# Patient Record
Sex: Male | Born: 1955 | Race: White | Hispanic: No | Marital: Single | Smoking: Current every day smoker
Health system: Southern US, Community
[De-identification: ages and names within clinical notes are randomized; demographics above are authoritative.]

## PROBLEM LIST (undated history)

## (undated) DIAGNOSIS — S42309A Unspecified fracture of shaft of humerus, unspecified arm, initial encounter for closed fracture: Secondary | ICD-10-CM

## (undated) DIAGNOSIS — T7840XA Allergy, unspecified, initial encounter: Secondary | ICD-10-CM

## (undated) DIAGNOSIS — I10 Essential (primary) hypertension: Secondary | ICD-10-CM

## (undated) DIAGNOSIS — E119 Type 2 diabetes mellitus without complications: Secondary | ICD-10-CM

## (undated) DIAGNOSIS — F329 Major depressive disorder, single episode, unspecified: Secondary | ICD-10-CM

## (undated) HISTORY — DX: Major depressive disorder, single episode, unspecified: F32.9

## (undated) HISTORY — DX: Unspecified fracture of shaft of humerus, unspecified arm, initial encounter for closed fracture: S42.309A

## (undated) HISTORY — PX: APPENDECTOMY: SHX54

## (undated) HISTORY — DX: Allergy, unspecified, initial encounter: T78.40XA

## (undated) HISTORY — PX: KNEE ARTHROPLASTY: SHX992

## (undated) HISTORY — DX: Essential (primary) hypertension: I10

## (undated) HISTORY — DX: Type 2 diabetes mellitus without complications: E11.9

---

## 1998-06-26 ENCOUNTER — Emergency Department (HOSPITAL_COMMUNITY): Admission: EM | Admit: 1998-06-26 | Discharge: 1998-06-26 | Payer: Self-pay | Admitting: Internal Medicine

## 2014-01-16 LAB — COMPREHENSIVE METABOLIC PANEL
Albumin: 4 g/dL (ref 3.4–5.0)
Alkaline Phosphatase: 108 U/L
Anion Gap: 13 (ref 7–16)
BUN: 6 mg/dL — ABNORMAL LOW (ref 7–18)
Bilirubin,Total: 0.5 mg/dL (ref 0.2–1.0)
Calcium, Total: 9.5 mg/dL (ref 8.5–10.1)
Chloride: 97 mmol/L — ABNORMAL LOW (ref 98–107)
Co2: 24 mmol/L (ref 21–32)
Creatinine: 0.59 mg/dL — ABNORMAL LOW (ref 0.60–1.30)
EGFR (African American): >60
EGFR (Non-African Amer.): >60
Glucose: 222 mg/dL — ABNORMAL HIGH (ref 65–99)
Osmolality: 273 (ref 275–301)
Potassium: 3.3 mmol/L — ABNORMAL LOW (ref 3.5–5.1)
SGOT(AST): 50 U/L — ABNORMAL HIGH (ref 15–37)
SGPT (ALT): 44 U/L
Sodium: 134 mmol/L — ABNORMAL LOW (ref 136–145)
Total Protein: 8.2 g/dL (ref 6.4–8.2)

## 2014-01-16 LAB — URINALYSIS, COMPLETE
Bacteria: NONE SEEN
Bilirubin,UR: NEGATIVE
Blood: NEGATIVE
Glucose,UR: >=500 mg/dL (ref 0–75)
Leukocyte Esterase: NEGATIVE
Nitrite: NEGATIVE
Ph: 5 (ref 4.5–8.0)
Protein: 30
RBC,UR: 1 /HPF (ref 0–5)
Specific Gravity: 1.023 (ref 1.003–1.030)
Squamous Epithelial: 1
WBC UR: 2 /HPF (ref 0–5)

## 2014-01-16 LAB — DRUG SCREEN, URINE

## 2014-01-16 LAB — CBC
HCT: 48 % (ref 40.0–52.0)
HGB: 16.9 g/dL (ref 13.0–18.0)
MCH: 34.6 pg — ABNORMAL HIGH (ref 26.0–34.0)
MCHC: 35.3 g/dL (ref 32.0–36.0)
MCV: 98 fL (ref 80–100)
Platelet: 232 10*3/uL (ref 150–440)
RBC: 4.89 10*6/uL (ref 4.40–5.90)
RDW: 13.9 % (ref 11.5–14.5)
WBC: 10 10*3/uL (ref 3.8–10.6)

## 2014-01-16 LAB — ACETAMINOPHEN LEVEL: Acetaminophen: <2 ug/mL

## 2014-01-16 LAB — ETHANOL
Ethanol %: 0.003 % (ref 0.000–0.080)
Ethanol: 3 mg/dL

## 2014-01-16 LAB — SALICYLATE LEVEL: Salicylates, Serum: 2.4 mg/dL

## 2014-01-17 ENCOUNTER — Inpatient Hospital Stay: Payer: Self-pay | Admitting: Psychiatry

## 2014-01-20 LAB — LIPID PANEL
Cholesterol: 255 mg/dL — ABNORMAL HIGH (ref 0–200)
HDL Cholesterol: 38 mg/dL — ABNORMAL LOW (ref 40–60)
Ldl Cholesterol, Calc: 184 mg/dL — ABNORMAL HIGH (ref 0–100)
Triglycerides: 165 mg/dL (ref 0–200)
VLDL Cholesterol, Calc: 33 mg/dL (ref 5–40)

## 2014-01-20 LAB — HEMOGLOBIN A1C: Hemoglobin A1C: 11.1 % — ABNORMAL HIGH (ref 4.2–6.3)

## 2014-03-04 DIAGNOSIS — I10 Essential (primary) hypertension: Secondary | ICD-10-CM | POA: Insufficient documentation

## 2014-03-04 DIAGNOSIS — E785 Hyperlipidemia, unspecified: Secondary | ICD-10-CM | POA: Insufficient documentation

## 2014-03-04 DIAGNOSIS — F32A Depression, unspecified: Secondary | ICD-10-CM | POA: Insufficient documentation

## 2014-03-04 DIAGNOSIS — F329 Major depressive disorder, single episode, unspecified: Secondary | ICD-10-CM | POA: Insufficient documentation

## 2014-03-04 DIAGNOSIS — E119 Type 2 diabetes mellitus without complications: Secondary | ICD-10-CM | POA: Insufficient documentation

## 2014-03-26 DIAGNOSIS — G629 Polyneuropathy, unspecified: Secondary | ICD-10-CM | POA: Insufficient documentation

## 2014-03-29 ENCOUNTER — Emergency Department: Payer: Self-pay | Admitting: Emergency Medicine

## 2014-04-09 ENCOUNTER — Ambulatory Visit: Payer: Self-pay | Admitting: General Practice

## 2014-04-15 ENCOUNTER — Observation Stay: Payer: Self-pay | Admitting: General Practice

## 2014-04-16 LAB — BASIC METABOLIC PANEL
Anion Gap: 8 (ref 7–16)
BUN: 9 mg/dL (ref 7–18)
CHLORIDE: 103 mmol/L (ref 98–107)
Calcium, Total: 8.3 mg/dL — ABNORMAL LOW (ref 8.5–10.1)
Co2: 26 mmol/L (ref 21–32)
Creatinine: 0.68 mg/dL (ref 0.60–1.30)
EGFR (Non-African Amer.): >60
Glucose: 141 mg/dL — ABNORMAL HIGH (ref 65–99)
OSMOLALITY: 275 (ref 275–301)
POTASSIUM: 4.4 mmol/L (ref 3.5–5.1)
SODIUM: 137 mmol/L (ref 136–145)

## 2014-04-16 LAB — HEMOGLOBIN: HGB: 12.3 g/dL — ABNORMAL LOW (ref 13.0–18.0)

## 2014-04-16 LAB — PLATELET COUNT: Platelet: 302 10*3/uL (ref 150–440)

## 2014-04-30 LAB — HM DIABETES EYE EXAM

## 2014-06-19 DIAGNOSIS — S42309A Unspecified fracture of shaft of humerus, unspecified arm, initial encounter for closed fracture: Secondary | ICD-10-CM

## 2014-06-19 HISTORY — PX: HUMERUS FRACTURE SURGERY: SHX670

## 2014-06-19 HISTORY — DX: Unspecified fracture of shaft of humerus, unspecified arm, initial encounter for closed fracture: S42.309A

## 2014-09-29 LAB — LIPID PANEL
CHOLESTEROL: 232 mg/dL — AB (ref 0–200)
HDL: 90 mg/dL — AB (ref 35–70)
LDL Cholesterol: 121 mg/dL
TRIGLYCERIDES: 104 mg/dL (ref 40–160)

## 2014-10-10 NOTE — Consult Note (Signed)
PATIENT NAME:  Matthew Washington, Massimiliano H MR#:  [id number] DATE OF BIRTH:  Mar 25, 1956  DATE OF CONSULTATION:  01/20/2014  CONSULTING PHYSICIAN:  Vivek J. Cherlynn KaiserSainani, MD  REFERRING PHYSICIAN:  Jolanta B. Pucilowska, MD.  PRIMARY CARE PHYSICIAN:  None.  REASON FOR CONSULTATION: New-onset diabetes.   HISTORY OF PRESENT ILLNESS: This is a 59 year old male who was admitted to behavioral medicine due to depression and suicidal ideations. The patient was recently diagnosed with diabetes about 4-5 days ago. He was started on metformin but has not been taking it since he was admitted to behavioral medicine. The patient although has been on the metformin while he has been here in the hospital. His blood sugars are still somewhat uncontrolled. Hospitalist services was contacted for further treatment and evaluation.   The patient denies any chest pain, shortness of breath, nausea, vomiting, abdominal pain, fevers, chills, cough or any other associated symptoms presently.   REVIEW OF SYSTEMS: CONSTITUTIONAL: No documented fever. No weight gain, no weight loss.  EYES: No blurry or double vision.  EARS, NOSE, THROAT: No tinnitus. No postnasal drip. No redness of the oropharynx.  RESPIRATORY: No cough, no wheeze, no hemoptysis, no dyspnea.  CARDIOVASCULAR: No chest pain, no orthopnea, no palpitations, or syncope.  GASTROINTESTINAL: No nausea, no vomiting, no diarrhea, no abdominal pain. No melena or hematochezia.  GENITOURINARY: No dysuria or hematuria.  ENDOCRINE: No polyuria or nocturia. No heat or cold intolerance.  HEMATOLOGIC: No anemia, no bruising, no bleeding.  INTEGUMENT: No rashes or lesions.  MUSCULOSKELETAL: No arthritis, no swelling, no gout.  NEUROLOGIC: No numbness or tingling. No ataxia. No seizure-type activity,  PSYCHIATRIC: Positive depression, no anxiety. No ADD.   PAST MEDICAL HISTORY: Consistent with depression.   ALLERGIES: PENICILLIN, WHICH CAUSE HIVES.   SOCIAL HISTORY: Does smoke about  2 packs per day, has been smoking for the last 30-40 years, also used to drink heavily but quit a few years back. No illicit drug abuse. Lives by himself.   FAMILY HISTORY: Mother and father are both deceased, neither of them had diabetes. Nobody in his family has diabetes presently.   PHYSICAL EXAMINATION:  VITAL SIGNS: Temperature 97.4, pulse 114, respirations 20, blood pressure 115/76.  GENERAL: He is a pleasant-appearing male in no apparent distress.  HEAD, EYES, EARS, NOSE AND THROAT: Atraumatic, normocephalic. Extraocular muscles are intact. Pupils are reactive to light. Sclerae are anicteric. No conjunctival injection. No pharyngeal erythema.  NECK: Supple. No jugular venous distention. No bruits. No lymphadenopathy or thyromegaly.  HEART: Regular rate and rhythm. No murmurs, no rubs, no clicks.  LUNGS: Clear to auscultation bilaterally. No rales, no rhonchi, no wheezes.  ABDOMEN: Soft, flat, nontender, nondistended. Has good bowel sounds. No hepatosplenomegaly appreciated.  EXTREMITIES: No evidence of any cyanosis, clubbing, or peripheral edema. Has +2 pedal and radial pulses bilaterally.  NEUROLOGICAL: The patient is alert, awake, and oriented x 3 with no focal, motor or sensory deficits.  SKIN: Moist and warm with no rashes appreciated.  LYMPHATIC: There is no cervical or axillary lymphadenopathy.   LABORATORY DATA: This is lab work done on July 31: Urine toxicology is negative. Serum glucose of 222, BUN 6, creatinine 0.5, sodium 134, potassium 3.3, chloride 94, bicarbonate 24. LFTs within normal limits. White cell count is 10, hemoglobin 16.9, hematocrit 48, platelet count 232,000.    The patient's lipid profile checked on August 4 showed an LDL of 184, total cholesterol 55, HDL 38, hemoglobin A1c of 11.   ASSESSMENT AND PLAN: The patient is  a 59 year old male with long-standing and depression who was admitted to behavioral medicine for suicidal ideations, noted to have elevated blood  sugars with hyperlipidemia.  1.  New-onset diabetes. The patient's blood sugars are somewhat labile right now, although I do not think he has given the metformin a chance to work yet, as he has not been taking it. I would continue metformin for now. The patient likely will need his oral meds adjusted as an outpatient. He needs to get himself a primary care physician so this can be accomplished as an outpatient. The patient likely also needs a diabetic lifestyle assessment to be taught about the disease process. He also needs a glucometer, test strips and lancets, which he said he can purchase on the outside.  2.  Hyperlipidemia. The patient's total cholesterol is over 200, LDL is greater than 130. The patient likely needs to be started on therapy. I will start him on some low-dose simvastatin for now. He likely needs to have a lipid profile checked in the next 3 months or so.  3.  Proteinuria. The patient's urinalysis did show some mild proteinuria. I will start him on a low-dose ACE inhibitor for now.  4.  Depression with suicidal ideations. Continue care as per psychiatry.   Thank you so much for the consultation.  The patient is okay to be discharged from a medical standpoint on the medications as mentioned above.   Time spent in this consult: 50 minutes.   ____________________________ Rolly Pancake. Cherlynn Kaiser, MD vjs:lt D: 01/20/2014 10:56:27 ET T: 01/20/2014 11:59:07 ET JOB#: [id number]  cc: Rolly Pancake. Cherlynn Kaiser, MD, <Dictator> Houston Siren MD ELECTRONICALLY SIGNED 01/30/2014 14:26

## 2014-10-10 NOTE — H&P (Signed)
PATIENT NAME:  Matthew Washington, Dirk H MR#:  [id number] DATE OF BIRTH:  1955-10-08  DATE OF ADMISSION:  01/17/2014  RACE:  White.  AGE:  59.  EXAMINING SERVICE:  Psychiatry.  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  Patient lives with a roommate in an apartment.  Patient comes for  inpatient on psychiatry at [facility] with a chief complaint of being depressed and having suicidal ideas for the past 2 days and thoughts about taking an overdose of pills.  HISTORY OF PRESENT ILLNESS:  The patient reports that recently he was diagnosed with [facility] and they just diagnosed him and gave him metformin medication and let him go without explaining all the details about the same.  He started getting depressed to the extent that he started having suicidal ideas and so he told his doctor at [facility] Dr. Marguerite OleaMoffett who recommended inpatient hospitalization.    PAST PSYCHIATRIC HISTORY:  No previous history of inpatient on psychiatry.  Being followed by Dr[facility] at [facility] for several years for depression.  No history of suicides.  According to the information obtained the dad, patient has been stable on Prozac 40 mg and Ambien 10 mg for his depression.  FAMILY MENTAL HISTORY:  No known H/O mental health problems in the family. no suicides within the family.    FAMILY HISTORY:  Raise by parents.  Father is retired from Public relations account executive[employer].  Father is living.  Mother died after status post motor vehicle accident and complications for the same.  Has 4 brothers and 4 sisters.  He close to the family.  PERSONAL HISTORY:  Born in [place].  Moved to [place] in [place].  Graduated from high school, no college.  Work:  His long job was in Therapist, occupationalcatering and worked for 15 years.  Last worked 4-1/2 years ago and quit because he could not keep up with the job.    MILITARY HISTORY:  None.  MARRIAGES:  Never married.  No children.    ALCOHOL AND DRUGS:  Used to drink alcohol occasionally.  Had 1 DWI and lost his driver's license.  Apparently, he got back his driver's license.  Denies street or prescription drug abuse.  Does smoke nicotine cigarettes at a rate of a pack a day for many years.  MEDICAL HISTORY:  Normalized blood pressure.  Was diagnosed with diabetes mellitus only a few days ago.  Status post appendectomy.  Status post calcium deposit removed from the neck.  No history of MVA and.  never been unconscious.   ALLERGIES:  PENICILLIN.   Not being followed by physician .  Last appointment with physician was quite some time ago and he cannot remember the date.    PHYSICAL EXAMINATION: VITAL SIGNS:  Temperature is 98.4, pulse is 90 per minute and regular, respirations 20 per minute and regular, blood pressure is 130/84 mmHg.  HEENT:  Normocephalic, atraumatic.  Eyes:  PERLA.  Fundi benign.   NECK: Supple without any organomegaly or thyromegaly. CHEST:  Normal expansion.  Normal breath sounds.   HEART:  Normal S1 without any murmurs or rubs.  ABDOMEN:  Soft.  No organomegaly.  Bowel sounds heard. RECTAL:  Deferred. NEUROLOGIC:  Gait is 2+ normal.  Romberg is negative.  Cranial nerves II-XII grossly intact.   MENTAL STATUS EXAMINATION:  Patient dressed  in street clothes.  Alert and oriented to place, person, and time.  Fully aware of the situation.  Has been feeling hopeless and helpless since his recent diagnosis of diabetes, but however, he started feeling better since he came here.   Had suicidal wishes, but currently contracts for safety.  No psychosis.  Denies auditory or visual delusions and paranoid thinking.  Denies any thought using thought control.  He could spell the word "world" forward, but could not spell it backwards.  Memory is intact.  Cognition is intact.  General knowledge and permission state regarding judgment for fire.  He  said he would leave or put it out.  Insight and judgment guarded.  Impulse control poor.  IMPRESSION:   AXIS I:  Major depressive, single episode, with suicidal ideas, and contract for safety.  Rule out adjustment disorder with depressed mood since he was diagnosed with diabetes a week ago and got depressed about the same.  Nicotine dependence.  Alcohol abuse in remission. AXIS II:  Deferred. AXIS III:  Status post appendectomy and status post calcium deposit removed.  Diagnosed with diabetes mellitus a few days ago.  AXIS IV:  Long history of depression and being followed by Dr. Marguerite Olea at [facility] and recent diagnosis of diabetes mellitus and not having enough coping skills in dealing with the new diagnosis of diabetes mellitus. AXIS V:  Global Assessment of Functioning 25.  PLAN:  Patient admitted to [facility] for close observation while here.  He will be started back on his Prozac, on which he has been stabilized at [facility] for his depression.  He will be started back on his metformin.  During this stay in the hospital, he will be given milieu therapy and supportive counseling. Better coping skills in dealing with the recent diagnosis of diabetes mellitus and dealing with the same.  At that time, we will discharge the patient when stabilized and follow up appointment will be made in that community for medical help and mental health.   ____________________________ Jannet Mantis. Guss Bunde, MD skc:ts D: 01/17/2014 19:17:06 ET T: 01/17/2014 19:26:35 ET JOB#: [id number]  cc: Monika Salk K. Guss Bunde, MD, <Dictator> Beau Fanny MD ELECTRONICALLY SIGNED 01/18/2014 9:50

## 2014-10-10 NOTE — Consult Note (Signed)
PATIENT NAME:  Matthew Washington,Matthew Washington.E MR#:  [id number] DATE OF BIRTH:  07/20/1953  DATE OF H/P:  01/17/2014  CONSULTING PHYSICIAN:  Jaydeen Darley K. Guss Bundehalla, MD  SEX: Male.  RACE: White.  AGE: 59 years.  INITIAL PSYCHIATRIC EVALUATION:  IDENTIFYING INFORMATION: The patient is a 59 year old white male retired from working on [employer] and is divorced 3 times and admits that he lives by himself. The patient comes to inpatient admission on psychiatry at [facility] with a chief complaint "I'm tired of drinking, I want to quit drinking."   HISTORY OF PRESENT ILLNESS: The patient reports that he has been drinking all of his life and currently drinking at the rate of 6-pack of beer or even more and decided that he should get help for drinking.  PAST PSYCHIATRIC HISTORY: History of inpatient holds on psychiatry on 2 occasions for alcohol detox. He reports that he started drinking under stress and this time because of breakup with a relationship.   HISTORY OF SUICIDE ATTEMPT: Ten years ago and he drank rat poison and was admitted to the hospital and does not remember the details of the same.   FAMILY HISTORY OF MENTAL ILLNESS: No known H/O mental illness in family., no known history of suicide in the family. He was raised by parents who are both dead. Has siblings and he is in touch with them sometimes only.  SOCIAL HISTORY: Patient was born in [place] and raised in [place] by both of his biological parents until they got divorced when he was 59 years old. He moved from [place] to [place] at age 89 years. They lived with the biological father and stepmother after that. Has 1 sister, 1 half brother, and 1 half sister. He dropped out at a tenth grade education. Denies any physical or sexual abuse while growing up. He never got his GED, started working on [employer] for the past 30 years and unable to find steady employment for the past several years and so retired.  MARRIAGE HISTORY: Married 3 times  and is divorced from his third wife of too many years. Has 2 children, 1 son and 1 daughter who are grown. He gets to see them sometimes.  LEGAL HISTORY: Denies any arrests or incarcerations. When he was asked about DWI he said "plenty I guess". He walks most of the time. Does admit smoking nicotine cigarettes, a pack a day for many years.  MEDICAL HISTORY: No know history of high blood pressure, no diabetes mellitus, no major surgery, no major injuries. No history of MVA  never been unconscious. H/O Pulmonary Embolism and was treated for the same and had a GI Bleeed in April of 2015 when he was admitted at [facility] and was evaluated and Gastric work up was done which was negative and was stopped Coumadine and no further information is obted if this was started back on not and staff will check with [facility] on Monday 01/19/2014 for the same.  ALLERGIES: No known drug allergies.  Being [facility], last appointment and month ago. Next appointment needs to be made.   According to information obtained from the chart, he was inpatient at [facility] in April 2015 for a gastrointestinal bleed and discharge diagnosis of melena with history of esophagogastroduodenoscopy which was negative and colonoscopy which was negative for acute blood. The patient was stopped off Coumadin which was not restarted according to the charts and this should be checked out by the staff on Monday morning.  PHYSICAL  EXAMINATION: VITALS SIGNS: Temperature is 98.4, respirations 18 per minute regular, heart rate is 78 per minute regular, blood pressure is 118/80 mmHg.  HEENT: Head is normocephalic, atraumatic. Eyes: PERRLA, fundi are benign. Dentition was extremely poor. Extraocular movements are intact.  NECK: Supple without any organomegaly, thyromegaly. LUNGS: Clear to auscultation.  HEART: S1, S2 heard with a regular rate and rhythm. No murmurs, rubs, or gallops.  ABDOMEN: Soft, no organomegaly. Bowel sounds  heard. EXTREMITIES: No cyanosis, clubbing, or edema, 2+ pedal pulses. RECTAL: Deferred. PELVIC: Deferred. NEUROLOGIC: Gait was slightly unsteady due to recent alcohol intoxication. Cranial nerves II through XII are grossly intact.  MENTAL STATUS EXAMINATION: The patient is dressed in hospital scrubs, disheveled in appearance. Very poor grooming. He knows that he was at [facility] and knew the date. He knew the capital of [address], capital of [place], name of the current president. Affect is flat, mood restricted. Appears depressed. He denies any suicidal thoughts or ideas and wants to get help. Contracts for safety. Admits that he hears voices and these voices are voices of conscious telling him to do this and do that and but ignores the same. He had difficulty spelling the word world forward because of his education level. Recalls for recent and remote events was intact after a few minutes and several minutes. He could count money. Insight and judgment guarded. Impulse control poor.  IMPRESSION:  AXIS I: Major depressive disorder, recurrent. Alcohol dependence, chronic continuous with intoxication, nicotine dependence.   AXIS II: Deferred.   AXIS III: Chronic right hip pain. A history of being on Coumadin which was stopped at [facility].    AXIS IV: Severe. Occupational, financial, and loneliness and not able to keep up a healthy relationship.   AXIS V: GAF 25 at the time of admission.  PLAN: Patient admitted to [facility] for close observation and help. He will be started on detox protocol. During the stay in the hospital, he will be given milieu therapy and supportive counseling. He will take part in and individual group therapy when he is ready to do so with special attention to substance abuse and consequences of the same. At the time of discharge, patient is stabilized, appropriate followup appointments made in the community.     ____________________________ Jannet Mantis.  Guss Bunde, MD skc:lt D: 01/17/2014 18:36:32 ET T: 01/17/2014 21:21:19 ET JOB#: [id number]  cc: Monika Salk K. Guss Bunde, MD, <Dictator> Beau Fanny MD ELECTRONICALLY SIGNED 01/18/2014 9:58

## 2014-10-10 NOTE — Op Note (Signed)
PATIENT NAME:  Matthew Washington, Matthew Washington MR#:  [id number] DATE OF BIRTH:  [birth year]  DATE OF PROCEDURE:  04/15/2014  PREOPERATIVE DIAGNOSIS: Right proximal humerus fracture (surgical neck).   POSTOPERATIVE DIAGNOSIS: Right proximal humerus fracture (surgical neck).    PROCEDURE PERFORMED: Open reduction and internal fixation of a right proximal humerus fracture.   SURGEON: Illene Labrador. Angie Fava., M.D.    ANESTHESIA: Interscalene block and general endotracheal.   ESTIMATED BLOOD LOSS: 250 mL.   FLUIDS REPLACED: 2700 mL of crystalloid.   DRAINS: One small Hemovac.   IMPLANTS UTILIZED: A Biomet 4-hole S3 proximal humerus plate, four 3.8 mm screws, six 4.0 mm standard pegs, and 2 set screws.   INDICATIONS FOR SURGERY: The patient is a 59 year old male, who fell approximately 10 days ago and landed on his right shoulder and upper arm. He had been evaluated in the Emergency Room, and placed in a sling. Upon presentation to the office, repeat radiographs demonstrated gross displacement of the right proximal humerus fracture. After discussion of the risks and benefits of surgical intervention, the patient expressed understanding of the risks and benefits, and agreed with plans for surgical intervention.   PROCEDURE IN DETAIL: The patient was brought to the operating room, and after adequate interscalene block and general endotracheal anesthesia was achieved, the patient was positioned in a beach chair position. The head was secured in a headrest and all bony prominences were well padded. Visualization of the proximal humerus was performed in multiple planes using the C-arm. The patient's right shoulder and arm were cleaned and prepped with alcohol and DuraPrep, draped in the usual sterile fashion. A "timeout" was performed as per usual protocol.   An anterior oblique incision was made extending from the mid clavicle distally towards the insertion of the deltoid. The subcutaneous tissue was dissected and the  deltopectoral interval was identified. The cephalic vein was mobilized and reflected medially. Dissection was continued laterally with evacuation of a large hematoma. Fracture site was identified and reduction maneuvers were performed. A 4-hole S3 proximal humerus plate was positioned and an initial guidewire was inserted. Good position was noted in multiple planes using the C-arm. The distal portion of the plate was then initially stabilized with a 3.8 mm screw in the slotted position. Again, good position was noted. Proximal fixation was then undertaken with placement of six 4.0 mm standard pegs. Good position was noted in multiple positions using the C-arm. Finally, the distal portion of the plate was further stabilized with an additional 3.8 mm cortical screw and two 3.8 mm screws utilized as locking screws with the associated small set screws. The shoulder was placed through range of motion with good maintenance of the reduction. Excellent position of the hardware and reduction was noted using the C-arm.   The wound was irrigated with copious amounts of normal saline with antibiotic solution and suctioned dry. Good hemostasis was appreciated. A Hemovac drain was placed in the wound bed and brought out through a separate stab incision to be attached to a Hemovac reservoir. The deltoid interval was repaired using interrupted sutures of #1 Vicryl. The subcutaneous tissue was approximated in layers using first #0 Vicryl. The subcutaneous tissue was approximated in layers using #2-0 Vicryl. The skin was closed with skin staples. A sterile dressing was applied followed by application of a sling.   The patient tolerated the procedure well. He was transported to the recovery room in stable condition.    ____________________________ Illene Labrador. Angie Fava., MD jph:JT D: 04/16/2014  00:24:37 ET T: 04/16/2014 11:14:45 ET JOB#: [id number]  cc: Fayrene FearingJames P. Angie FavaHooten Jr., MD, <Dictator> JAMES P Angie FavaHOOTEN JR MD ELECTRONICALLY  SIGNED 04/17/2014 9:55

## 2014-10-10 NOTE — Consult Note (Signed)
PATIENT NAME:  Matthew Washington, Merrit H MR#:  [id number] DATE OF BIRTH:  [birth year]  DATE OF CONSULTATION:  01/20/2014  CONSULTING PHYSICIAN:  Issa Kosmicki J. Cherlynn KaiserSainani, MD REFERRING PHYSICIAN: Jolanta B. Pucilowska, MD  PRIMARY CARE PHYSICIAN: Does not have one.   REASON FOR CONSULTATION: New-onset diabetes.   HISTORY OF PRESENT ILLNESS: This is a 59 year old male who was admitted to behavioral medicine due to depression and suicidal ideations. The patient was recently diagnosed with diabetes about 4-5 days ago. He was started on metformin but has not been taking it since he was admitted to behavioral medicine. The patient also has been on the metformin while he has been here in the hospital. His blood sugars are still somewhat uncontrolled. Hospice services were contacted for further treatment and evaluation. The patient denies any chest pain, shortness of breath, nausea, vomiting, abdominal pain, fevers, chills, cough, or any other associated symptoms presently.   REVIEW OF SYSTEMS: CONSTITUTIONAL: No documented fever. No weight gain or weight loss.  EYES: No blurry or double vision.  ENT: No tinnitus. No postnasal drip. No redness of the oropharynx.  RESPIRATORY: No cough, no wheeze, no hemoptysis, no dyspnea.  CARDIOVASCULAR: No chest pain, no orthopnea, no palpitations, no syncope.  GASTROINTESTINAL: No nausea or vomiting. No diarrhea. No abdominal pain. No melena or hematochezia.  GENITOURINARY: No dysuria or hematuria.  ENDOCRINE: No polyuria or nocturia. No cold intolerance. HEMATOLOGIC:  No anemia, no bruising, no bleeding.  INTEGUMENTARY: No rashes or lesions.  MUSCULOSKELETAL: No arthritis, no swellimg, no gout.  NEUROLOGIC: No numbness or tingling. No ataxia. No seizure activity. PSYCHIATRIC:  Positive depression.  No anxiety. No ADD.   PAST MEDICAL HISTORY: Consistent with depression.   ALLERGIES: PENICILLIN, which cause hives.   SOCIAL HISTORY: He does smoke about 2 packs per day and has  been smoking the past 30 to 40 years. Also used to drink heavily but quit a few years back. No illicit drug abuse. Lives by himself.   FAMILY HISTORY: Mother and Father are both deceased; neither one of them had diabetes. Nobody in his family has diabetes presently.   PHYSICAL EXAMINATION:  VITAL SIGNS: Temperature 97.4, pulse 114, respirations 20, blood pressure 115/76.  GENERAL: He is a pleasant-appearing male in no apparent distress. HEAD, EYES, EARS, NOSE, AND THROAT:  Exam is atraumatic, normocephalic. Extraocular muscles are intact. Pupils are equal and reactive to light. Sclerae anicteric. No conjunctival injection. No pharyngeal erythema.  NECK: Supple. No jugular venous distention. No bruits. No lymphadenopathy or thyromegaly.  HEART: Regular rate and rhythm. No murmurs, no rubs, no clicks.  LUNGS: Clear to auscultation bilaterally. No rales, no rhonchi, no wheezes.  ABDOMEN: Soft, flat, nontender, nondistended. Has good bowel sounds. No hepatosplenomegaly appreciated.  EXTREMITIES: No evidence of any cyanosis, clubbing, or peripheral edema. He has +2 pedal and radial pulses bilaterally.  NEUROLOGICAL: The patient is alert, awake, and oriented x 3 with no focal, motor, or sensory deficits appreciated bilaterally.    SKIN: Moist and warm with no rashes appreciated.  LYMPHATIC: There is no cervical or axillary lymphadenopathy.   LABORATORY DATA: Showed a serum glucose of 222, BUN 6, creatinine 0.5, sodium 134, potassium 3.3, chloride 94, bicarbonate 24. LFTs within normal limits. Urine toxicology is negative. White cell count is 10, hemoglobin 16.9, hematocrit 48, platelet count 232,000. This is lab work done on July 31.  The patient's lipid profile checked on August 4 showed an LDL of 184, total cholesterol 255,  HDL 38. Hemoglobin A1c is  11.   ASSESSMENT AND PLAN: A 59 year old male with longstanding depression who was admitted to behavioral medicine for suicidal ideations and noted to  have elevated blood sugars with hyperlipidemia.   1. New-onset diabetes. The patient's blood sugars are somewhat labile right now, although I do not think he has given the metformin a chance to work yet, as he has not been taking it. I would continue metformin for now.  The patient may likely need his oral medications adjusted as an outpatient. He needs to get himself a primary care physician so this can be accomplished as an outpatient. The patient likely also needs a diabetic lifestyle assessment to be taught about the disease process. He also needs a glucometer and test strips and lancets, which he said he can purchase on the outside.  2. Hyperlipidemia. The patient's total cholesterol is over 200, LDL is greater than 130. The patient likely needs to be started on therapy. I will start him on some low-dose simvastatin for now. He likely needs to have a lipid profile checked in the next 3 months or so.  3. Proteinuria. The patient's urinalysis did show some mild proteinuria. I will start him on low-dose ACE inhibitor for now. 4. Depression with suicidal ideations. Continue care as per psychiatry.   Thank you so much for the consultation. The patient is okay to be discharged from the medical standpoint on the medications as mentioned above. Time spent in the consult 50 minutes.   ____________________________ Rolly Pancake. Cherlynn Kaiser, MD vjs:db D: 01/20/2014 10:56:00 ET T: 01/20/2014 11:44:17 ET JOB#: [id number]  cc: Rolly Pancake. Cherlynn Kaiser, MD, <Dictator> Houston Siren MD ELECTRONICALLY SIGNED 01/30/2014 14:26

## 2014-10-10 NOTE — Consult Note (Signed)
PATIENT NAME:  Matthew Washington, Matthew H MR#:  [id number] DATE OF BIRTH:  February 15, 1956  DATE OF CONSULTATION:  01/18/2014  CONSULTING PHYSICIAN:  Makaio Mach K. Guss Bundehalla, MD  SEX: Male.  RACE: White.  SUBJECTIVE: [facility]. The patient reports "I'm feeling much better. You have really helped me. I feel I'm ready to go home."  OBJECTIVE: Adequately dressed and  grooming is fair. Alert and oriented. Calm, pleasant, and cooperative. No agitation. Affect appears bright and cheerful. He reports that he has been helped a lot and they explained to him about his illness and how to take care of it and this has caused him not to feel depressed and not to have suicidal ideas or plans. Admits that he is able to rest better, feel better, eat better, and able to focus better. Insight and judgment improved. Impulse control improved.  IMPRESSION: Major depression, single episode with suicidal ideas at the time of admission because he was diagnosed with diabetes mellitus recently and physicians did not bother to help explain to him about the disease which our staff did and continue current medications. The patient was reassured  and coping skills discussed.   ____________________________ Jannet MantisSurya K. Guss Bundehalla, MD skc:lt D: 01/18/2014 19:25:06 ET T: 01/18/2014 21:20:28 ET JOB#: [id number]  cc: Monika SalkSurya K. Guss Bundehalla, MD, <Dictator> Beau FannySURYA K Alicianna Litchford MD ELECTRONICALLY SIGNED 01/19/2014 9:28

## 2014-10-10 NOTE — Consult Note (Signed)
Brief Consult Note: Diagnosis: 1. New Onset DM 2. Hyperipidemia 3. Depression w/ SI.   Patient was seen by consultant.   Consult note dictated.   Orders entered.   Discussed with Attending MD.   Comments: 59 yo male w/ long standing depression who was admitted to behavior medicine for SI.  Noted to have elevated BS w/ hyperlipidemia.   1. New Onset DM - cont. Metformin for now.   - needs a PCP and meds adjusted as outpatient.   - needs diabetic lifestyle assessment about disease process.    2. Hyperlipidemia - will start on low dose Simvastatin  3. Proteinuria - UA showed some mild proteinuria.  Will start on low dose ACe.   4. Depression w/ SI - cont. care as per Psych.   thanks for the consult.  O.k to be discharged from medical standpoint on the above meds.   Job # [id number].  Electronic Signatures: Houston SirenSainani, Vivek J (MD)  (Signed 04-Aug-15 10:56)  Authored: Brief Consult Note   Last Updated: 04-Aug-15 10:56 by Houston SirenSainani, Vivek J (MD)

## 2014-11-11 ENCOUNTER — Ambulatory Visit: Payer: Self-pay | Admitting: Internal Medicine

## 2014-11-11 LAB — BASIC METABOLIC PANEL
BUN: 6 mg/dL (ref 4–21)
CREATININE: 0.7 mg/dL (ref 0.6–1.3)
Glucose: 96 mg/dL
Potassium: 4.2 mmol/L (ref 3.4–5.3)
SODIUM: 139 mmol/L (ref 137–147)

## 2014-11-11 LAB — TSH: TSH: 6.38 u[IU]/mL — AB (ref 0.41–5.90)

## 2014-11-11 LAB — CBC AND DIFFERENTIAL
HCT: 42 % (ref 41–53)
Hemoglobin: 14.2 g/dL (ref 13.5–17.5)
NEUTROS ABS: 2 /uL
Platelets: 150 10*3/uL (ref 150–399)
WBC: 4.4 10^3/mL

## 2014-11-11 LAB — HEMOGLOBIN A1C: HEMOGLOBIN A1C: 5.3

## 2014-11-11 LAB — HEPATIC FUNCTION PANEL
ALT: 100 U/L — AB (ref 10–40)
AST: 100 U/L — AB (ref 14–40)
Alkaline Phosphatase: 121 U/L (ref 25–125)
BILIRUBIN, TOTAL: 0.4 mg/dL

## 2014-11-11 LAB — POCT ERYTHROCYTE SEDIMENTATION RATE, NON-AUTOMATED: Sed Rate: 8 mm

## 2014-11-11 LAB — PSA: PSA: 0.7

## 2014-11-20 ENCOUNTER — Other Ambulatory Visit: Payer: Self-pay | Admitting: Urology

## 2014-11-20 DIAGNOSIS — R748 Abnormal levels of other serum enzymes: Secondary | ICD-10-CM

## 2014-11-27 ENCOUNTER — Ambulatory Visit: Payer: Self-pay

## 2016-02-23 DIAGNOSIS — F32A Depression, unspecified: Secondary | ICD-10-CM

## 2016-02-23 DIAGNOSIS — G6189 Other inflammatory polyneuropathies: Secondary | ICD-10-CM

## 2016-02-23 DIAGNOSIS — E785 Hyperlipidemia, unspecified: Secondary | ICD-10-CM

## 2016-02-23 DIAGNOSIS — E119 Type 2 diabetes mellitus without complications: Secondary | ICD-10-CM

## 2016-02-23 DIAGNOSIS — F329 Major depressive disorder, single episode, unspecified: Secondary | ICD-10-CM

## 2016-02-23 DIAGNOSIS — I10 Essential (primary) hypertension: Secondary | ICD-10-CM

## 2016-03-24 IMAGING — CT CT OF THE RIGHT SHOULDER WITHOUT CONTRAST
2 series · 15 of 29 positions shown, 19 images · non-contrast
Comparison: Radiographs 03/29/2014

CLINICAL DATA: Fell in a restroom 2 weeks ago. Evaluate right
humeral neck fracture.

EXAM:
CT OF THE RIGHT SHOULDER WITHOUT CONTRAST
TECHNIQUE: Multidetector CT imaging was performed according to the standard
protocol. Multiplanar CT image reconstructions were also generated.

[Series 2: shoulder 3mm · axial · 0.50mm/px · z∈[-359,-188]mm · 10 of 136 slices shown, 13 images]
[im 11/136  soft-tissue]
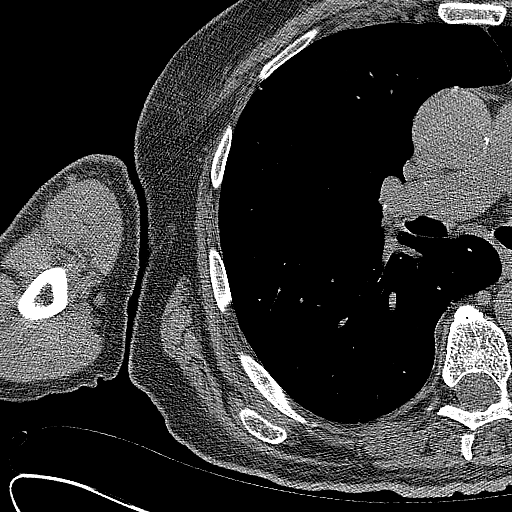
[im 11/136  bone]
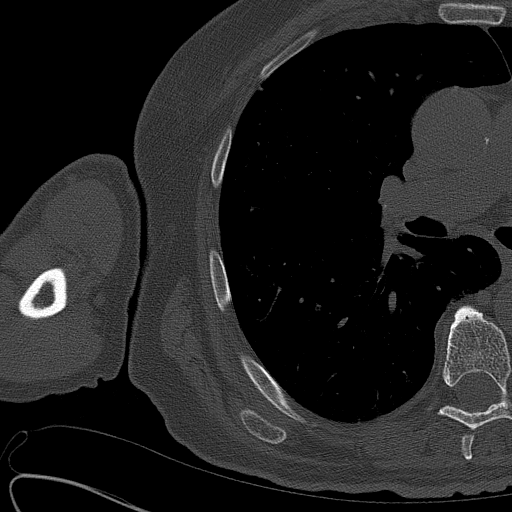
[im 21/136  bone]
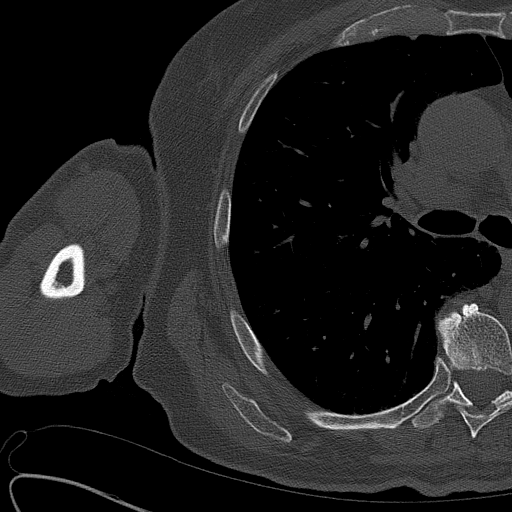
[im 42/136  bone]
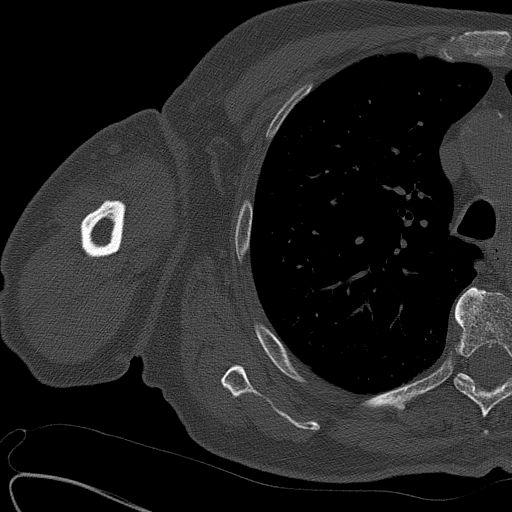
[im 52/136  bone]
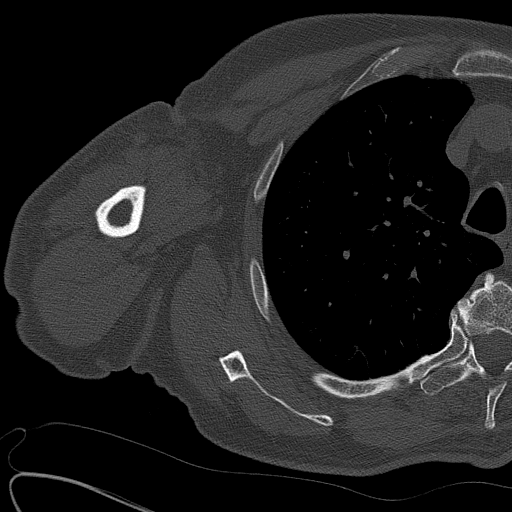
[im 63/136  soft-tissue]
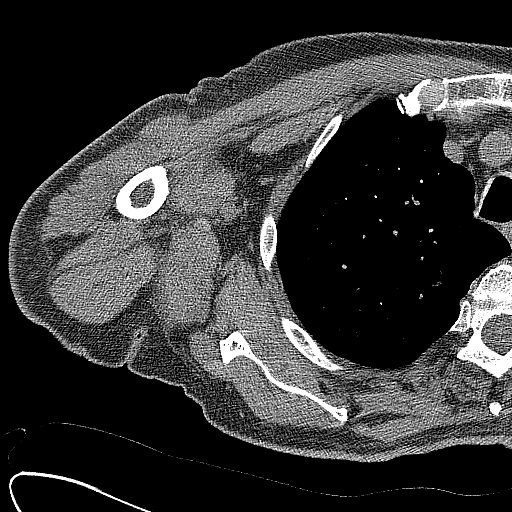
[im 63/136  bone]
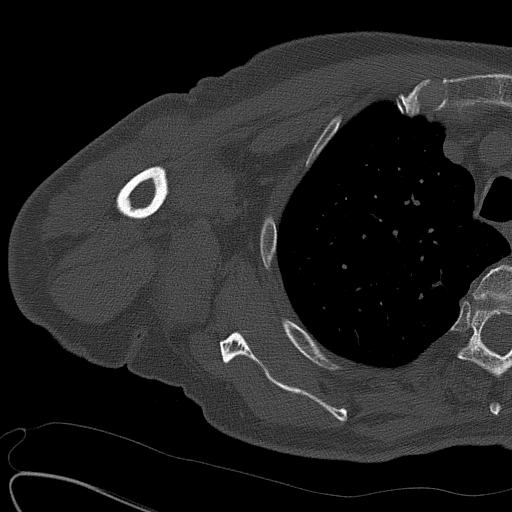
[im 73/136  bone]
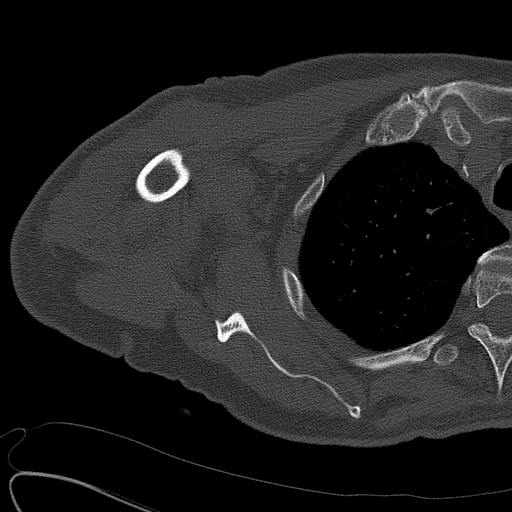
[im 84/136  bone]
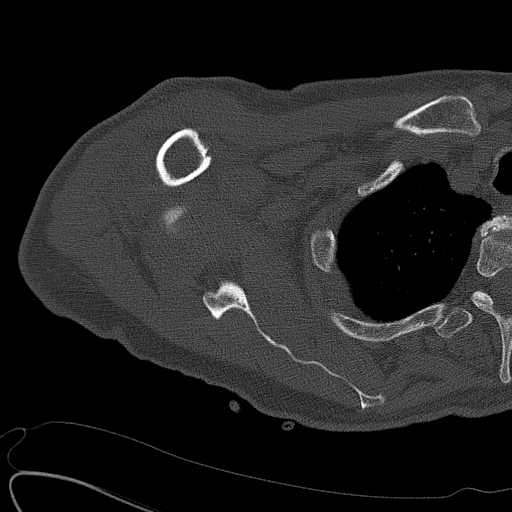
[im 104/136  bone]
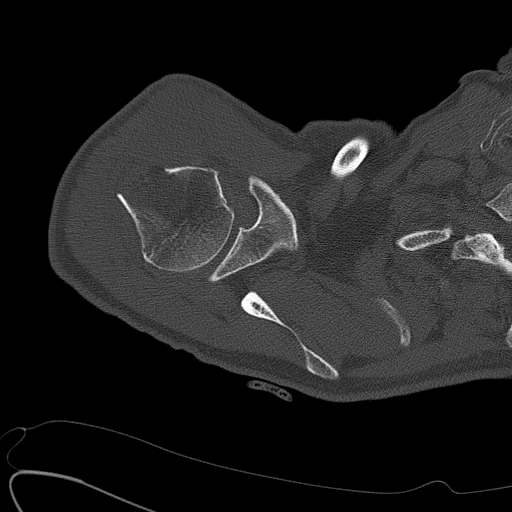
[im 115/136  soft-tissue]
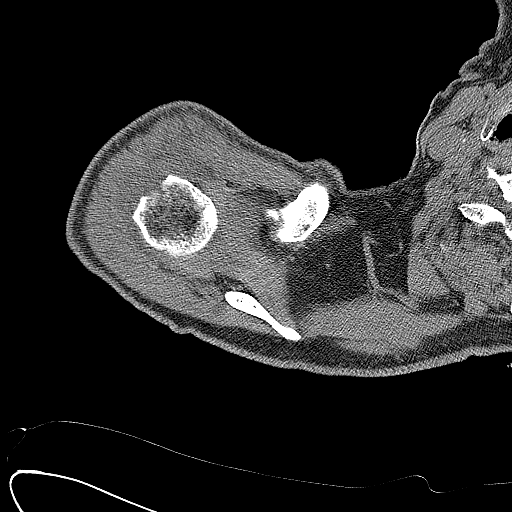
[im 115/136  bone]
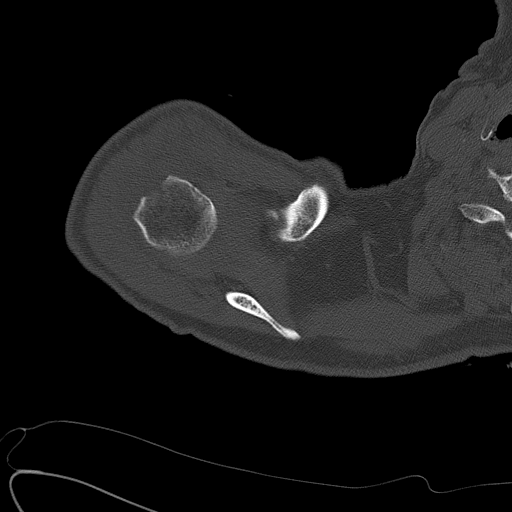
[im 125/136  bone]
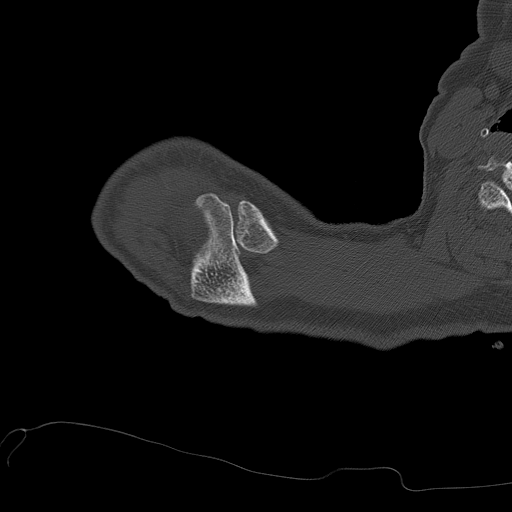

[Series 605: soft tissue sag · sagittal · 0.50mm/px · 5 of 101 slices shown, 6 images]
[im 34/101  bone]
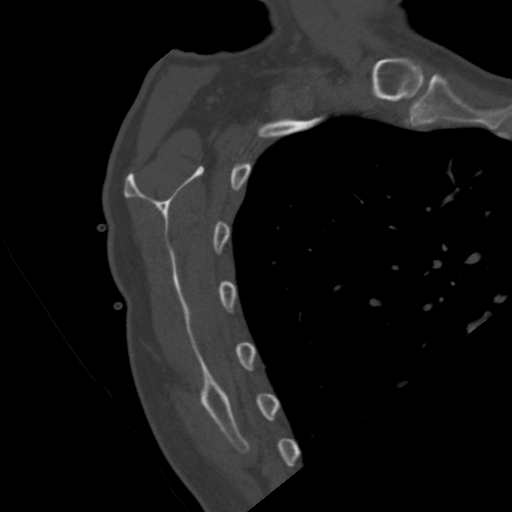
[im 42/101  bone]
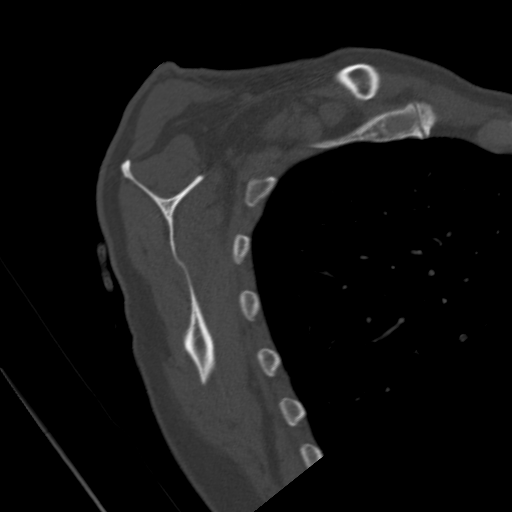
[im 51/101  soft-tissue]
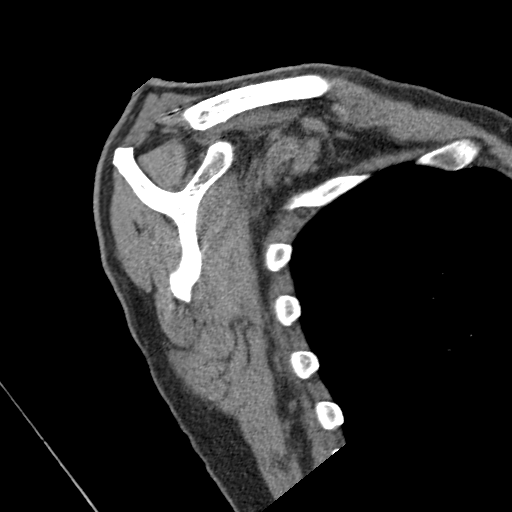
[im 51/101  bone]
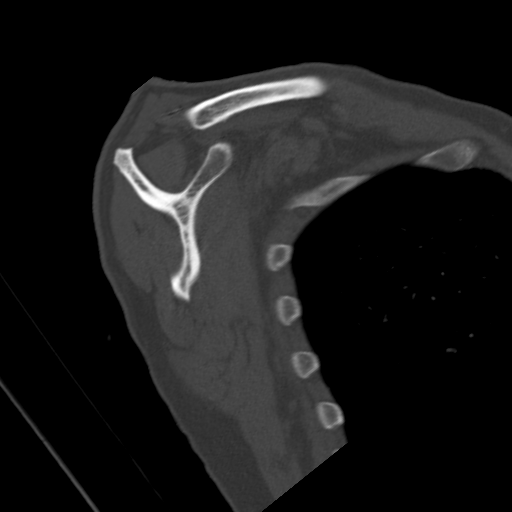
[im 59/101  bone]
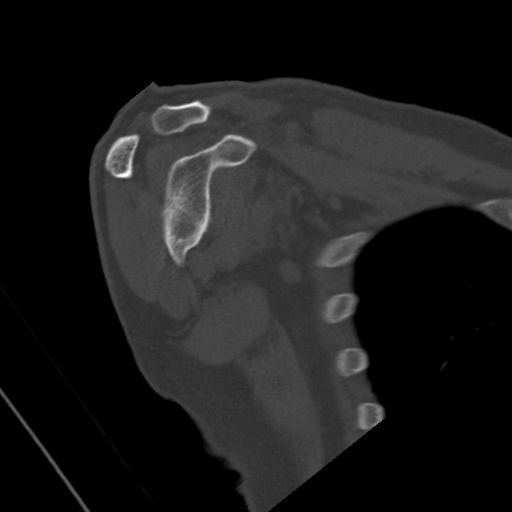
[im 67/101  bone]
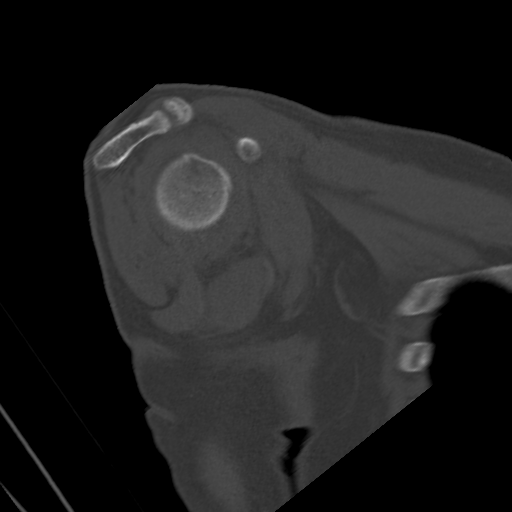

[15 of 29 positions shown; findings below may reference images not displayed]

FINDINGS: There is an oblique coursing fracture involving the upper humeral
shaft extending to the base of the greater tuberosity. There is
significant displacement of 1-1/2 shaft widths. The humeral shaft is
displaced anteriorly. Because of the unopposed pull of the rotator
cuff tendons the humeral head is significantly rotated medially. The
glenohumeral joint is maintained. There is a smaller nondisplaced
fracture coursing obliquely along the medial cortex of the humeral
neck.

No glenoid fracture. The AC joint is intact. No clavicle fracture.
The visualized right ribs are intact. The visualized right lung is
clear.
IMPRESSION: Displaced upper humeral shaft fracture extending into the humeral
neck.

## 2019-02-19 ENCOUNTER — Inpatient Hospital Stay
Admission: EM | Admit: 2019-02-19 | Discharge: 2019-03-20 | DRG: 640 | Disposition: E | Payer: Medicaid Other | Attending: Internal Medicine | Admitting: Internal Medicine

## 2019-02-19 ENCOUNTER — Emergency Department: Payer: Medicaid Other

## 2019-02-19 ENCOUNTER — Inpatient Hospital Stay: Payer: Medicaid Other

## 2019-02-19 ENCOUNTER — Other Ambulatory Visit: Payer: Self-pay

## 2019-02-19 DIAGNOSIS — F17210 Nicotine dependence, cigarettes, uncomplicated: Secondary | ICD-10-CM | POA: Diagnosis present

## 2019-02-19 DIAGNOSIS — B029 Zoster without complications: Secondary | ICD-10-CM | POA: Diagnosis not present

## 2019-02-19 DIAGNOSIS — I248 Other forms of acute ischemic heart disease: Secondary | ICD-10-CM | POA: Diagnosis present

## 2019-02-19 DIAGNOSIS — I426 Alcoholic cardiomyopathy: Secondary | ICD-10-CM | POA: Diagnosis present

## 2019-02-19 DIAGNOSIS — R570 Cardiogenic shock: Secondary | ICD-10-CM | POA: Diagnosis not present

## 2019-02-19 DIAGNOSIS — R600 Localized edema: Secondary | ICD-10-CM

## 2019-02-19 DIAGNOSIS — R0902 Hypoxemia: Secondary | ICD-10-CM

## 2019-02-19 DIAGNOSIS — J810 Acute pulmonary edema: Secondary | ICD-10-CM

## 2019-02-19 DIAGNOSIS — K761 Chronic passive congestion of liver: Secondary | ICD-10-CM | POA: Diagnosis present

## 2019-02-19 DIAGNOSIS — R262 Difficulty in walking, not elsewhere classified: Secondary | ICD-10-CM | POA: Diagnosis present

## 2019-02-19 DIAGNOSIS — J9601 Acute respiratory failure with hypoxia: Secondary | ICD-10-CM | POA: Diagnosis present

## 2019-02-19 DIAGNOSIS — E1142 Type 2 diabetes mellitus with diabetic polyneuropathy: Secondary | ICD-10-CM | POA: Diagnosis present

## 2019-02-19 DIAGNOSIS — Z880 Allergy status to penicillin: Secondary | ICD-10-CM

## 2019-02-19 DIAGNOSIS — R230 Cyanosis: Secondary | ICD-10-CM | POA: Diagnosis not present

## 2019-02-19 DIAGNOSIS — Z833 Family history of diabetes mellitus: Secondary | ICD-10-CM

## 2019-02-19 DIAGNOSIS — M79604 Pain in right leg: Secondary | ICD-10-CM

## 2019-02-19 DIAGNOSIS — Z8249 Family history of ischemic heart disease and other diseases of the circulatory system: Secondary | ICD-10-CM

## 2019-02-19 DIAGNOSIS — F1020 Alcohol dependence, uncomplicated: Secondary | ICD-10-CM | POA: Diagnosis present

## 2019-02-19 DIAGNOSIS — G4700 Insomnia, unspecified: Secondary | ICD-10-CM | POA: Diagnosis present

## 2019-02-19 DIAGNOSIS — Z888 Allergy status to other drugs, medicaments and biological substances status: Secondary | ICD-10-CM

## 2019-02-19 DIAGNOSIS — R748 Abnormal levels of other serum enzymes: Secondary | ICD-10-CM

## 2019-02-19 DIAGNOSIS — R197 Diarrhea, unspecified: Secondary | ICD-10-CM | POA: Diagnosis present

## 2019-02-19 DIAGNOSIS — Z66 Do not resuscitate: Secondary | ICD-10-CM | POA: Diagnosis not present

## 2019-02-19 DIAGNOSIS — I110 Hypertensive heart disease with heart failure: Secondary | ICD-10-CM | POA: Diagnosis present

## 2019-02-19 DIAGNOSIS — Z20828 Contact with and (suspected) exposure to other viral communicable diseases: Secondary | ICD-10-CM | POA: Diagnosis present

## 2019-02-19 DIAGNOSIS — E785 Hyperlipidemia, unspecified: Secondary | ICD-10-CM | POA: Diagnosis present

## 2019-02-19 DIAGNOSIS — E876 Hypokalemia: Secondary | ICD-10-CM | POA: Diagnosis not present

## 2019-02-19 DIAGNOSIS — E871 Hypo-osmolality and hyponatremia: Secondary | ICD-10-CM | POA: Diagnosis present

## 2019-02-19 DIAGNOSIS — Z79899 Other long term (current) drug therapy: Secondary | ICD-10-CM

## 2019-02-19 DIAGNOSIS — T501X5A Adverse effect of loop [high-ceiling] diuretics, initial encounter: Secondary | ICD-10-CM | POA: Diagnosis not present

## 2019-02-19 DIAGNOSIS — Z515 Encounter for palliative care: Secondary | ICD-10-CM | POA: Diagnosis not present

## 2019-02-19 DIAGNOSIS — K7010 Alcoholic hepatitis without ascites: Secondary | ICD-10-CM | POA: Diagnosis present

## 2019-02-19 DIAGNOSIS — R4182 Altered mental status, unspecified: Secondary | ICD-10-CM | POA: Diagnosis present

## 2019-02-19 DIAGNOSIS — Z9049 Acquired absence of other specified parts of digestive tract: Secondary | ICD-10-CM

## 2019-02-19 DIAGNOSIS — N144 Toxic nephropathy, not elsewhere classified: Secondary | ICD-10-CM | POA: Diagnosis present

## 2019-02-19 DIAGNOSIS — Y95 Nosocomial condition: Secondary | ICD-10-CM | POA: Diagnosis not present

## 2019-02-19 DIAGNOSIS — J189 Pneumonia, unspecified organism: Secondary | ICD-10-CM | POA: Diagnosis not present

## 2019-02-19 DIAGNOSIS — T510X1A Toxic effect of ethanol, accidental (unintentional), initial encounter: Secondary | ICD-10-CM | POA: Diagnosis present

## 2019-02-19 DIAGNOSIS — Z96651 Presence of right artificial knee joint: Secondary | ICD-10-CM | POA: Diagnosis present

## 2019-02-19 DIAGNOSIS — F329 Major depressive disorder, single episode, unspecified: Secondary | ICD-10-CM | POA: Diagnosis present

## 2019-02-19 DIAGNOSIS — R531 Weakness: Secondary | ICD-10-CM

## 2019-02-19 DIAGNOSIS — Z7189 Other specified counseling: Secondary | ICD-10-CM

## 2019-02-19 DIAGNOSIS — I5021 Acute systolic (congestive) heart failure: Secondary | ICD-10-CM | POA: Diagnosis present

## 2019-02-19 DIAGNOSIS — Z823 Family history of stroke: Secondary | ICD-10-CM

## 2019-02-19 DIAGNOSIS — J9600 Acute respiratory failure, unspecified whether with hypoxia or hypercapnia: Secondary | ICD-10-CM

## 2019-02-19 DIAGNOSIS — R52 Pain, unspecified: Secondary | ICD-10-CM

## 2019-02-19 DIAGNOSIS — R05 Cough: Secondary | ICD-10-CM

## 2019-02-19 DIAGNOSIS — Z7982 Long term (current) use of aspirin: Secondary | ICD-10-CM

## 2019-02-19 DIAGNOSIS — K7030 Alcoholic cirrhosis of liver without ascites: Secondary | ICD-10-CM | POA: Diagnosis present

## 2019-02-19 DIAGNOSIS — R059 Cough, unspecified: Secondary | ICD-10-CM

## 2019-02-19 DIAGNOSIS — Z7984 Long term (current) use of oral hypoglycemic drugs: Secondary | ICD-10-CM

## 2019-02-19 LAB — BLOOD GAS, VENOUS
Acid-Base Excess: 1.4 mmol/L (ref 0.0–2.0)
Bicarbonate: 24.9 mmol/L (ref 20.0–28.0)
O2 Saturation: 71.4 %
Patient temperature: 37
pCO2, Ven: 35 mmHg — ABNORMAL LOW (ref 44.0–60.0)
pH, Ven: 7.46 — ABNORMAL HIGH (ref 7.250–7.430)
pO2, Ven: 35 mmHg (ref 32.0–45.0)

## 2019-02-19 LAB — COMPREHENSIVE METABOLIC PANEL
ALT: 189 U/L — ABNORMAL HIGH (ref 0–44)
AST: 257 U/L — ABNORMAL HIGH (ref 15–41)
Albumin: 3.4 g/dL — ABNORMAL LOW (ref 3.5–5.0)
Alkaline Phosphatase: 152 U/L — ABNORMAL HIGH (ref 38–126)
Anion gap: 15 (ref 5–15)
BUN: 16 mg/dL (ref 8–23)
CO2: 20 mmol/L — ABNORMAL LOW (ref 22–32)
Calcium: 8.8 mg/dL — ABNORMAL LOW (ref 8.9–10.3)
Chloride: 79 mmol/L — ABNORMAL LOW (ref 98–111)
Creatinine, Ser: 0.91 mg/dL (ref 0.61–1.24)
GFR calc Af Amer: >60 mL/min (ref >60)
GFR calc non Af Amer: >60 mL/min (ref >60)
Glucose, Bld: 125 mg/dL — ABNORMAL HIGH (ref 70–99)
Potassium: 3.7 mmol/L (ref 3.5–5.1)
Sodium: 114 mmol/L — CL (ref 135–145)
Total Bilirubin: 5 mg/dL — ABNORMAL HIGH (ref 0.3–1.2)
Total Protein: 7.5 g/dL (ref 6.5–8.1)

## 2019-02-19 LAB — URINE DRUG SCREEN, QUALITATIVE (ARMC ONLY)
Amphetamines, Ur Screen: NOT DETECTED
Barbiturates, Ur Screen: NOT DETECTED
Benzodiazepine, Ur Scrn: NOT DETECTED
Cannabinoid 50 Ng, Ur ~~LOC~~: NOT DETECTED
Cocaine Metabolite,Ur ~~LOC~~: NOT DETECTED
MDMA (Ecstasy)Ur Screen: NOT DETECTED
Methadone Scn, Ur: NOT DETECTED
Opiate, Ur Screen: NOT DETECTED
Phencyclidine (PCP) Ur S: NOT DETECTED
Tricyclic, Ur Screen: NOT DETECTED

## 2019-02-19 LAB — BRAIN NATRIURETIC PEPTIDE: B Natriuretic Peptide: 2100 pg/mL — ABNORMAL HIGH (ref 0.0–100.0)

## 2019-02-19 LAB — PROTIME-INR
INR: 2 — ABNORMAL HIGH (ref 0.8–1.2)
Prothrombin Time: 22.5 seconds — ABNORMAL HIGH (ref 11.4–15.2)

## 2019-02-19 LAB — URINALYSIS, COMPLETE (UACMP) WITH MICROSCOPIC
Glucose, UA: NEGATIVE mg/dL
Hgb urine dipstick: NEGATIVE
Ketones, ur: 5 mg/dL — AB
Leukocytes,Ua: NEGATIVE
Nitrite: NEGATIVE
Protein, ur: 100 mg/dL — AB
Specific Gravity, Urine: 1.024 (ref 1.005–1.030)
Squamous Epithelial / HPF: NONE SEEN (ref 0–5)
pH: 5 (ref 5.0–8.0)

## 2019-02-19 LAB — ETHANOL: Alcohol, Ethyl (B): <10 mg/dL (ref <10)

## 2019-02-19 LAB — SODIUM, URINE, RANDOM: Sodium, Ur: <10 mmol/L

## 2019-02-19 LAB — SARS CORONAVIRUS 2 BY RT PCR (HOSPITAL ORDER, PERFORMED IN ~~LOC~~ HOSPITAL LAB): SARS Coronavirus 2: NEGATIVE

## 2019-02-19 LAB — CBC WITH DIFFERENTIAL/PLATELET
Abs Immature Granulocytes: 0.05 10*3/uL (ref 0.00–0.07)
Basophils Absolute: 0 10*3/uL (ref 0.0–0.1)
Basophils Relative: 0 %
Eosinophils Absolute: 0 10*3/uL (ref 0.0–0.5)
Eosinophils Relative: 0 %
HCT: 41 % (ref 39.0–52.0)
Hemoglobin: 15.2 g/dL (ref 13.0–17.0)
Immature Granulocytes: 1 %
Lymphocytes Relative: 7 %
Lymphs Abs: 0.5 10*3/uL — ABNORMAL LOW (ref 0.7–4.0)
MCH: 32.9 pg (ref 26.0–34.0)
MCHC: 37.1 g/dL — ABNORMAL HIGH (ref 30.0–36.0)
MCV: 88.7 fL (ref 80.0–100.0)
Monocytes Absolute: 0.6 10*3/uL (ref 0.1–1.0)
Monocytes Relative: 8 %
Neutro Abs: 6.7 10*3/uL (ref 1.7–7.7)
Neutrophils Relative %: 84 %
Platelets: 155 10*3/uL (ref 150–400)
RBC: 4.62 MIL/uL (ref 4.22–5.81)
RDW: 14.2 % (ref 11.5–15.5)
WBC: 8 10*3/uL (ref 4.0–10.5)
nRBC: 0 % (ref 0.0–0.2)

## 2019-02-19 LAB — TROPONIN I (HIGH SENSITIVITY)
Troponin I (High Sensitivity): 32 ng/L — ABNORMAL HIGH (ref <18)
Troponin I (High Sensitivity): 28 ng/L — ABNORMAL HIGH (ref <18)
Troponin I (High Sensitivity): 32 ng/L — ABNORMAL HIGH (ref <18)

## 2019-02-19 LAB — MRSA PCR SCREENING: MRSA by PCR: NEGATIVE

## 2019-02-19 LAB — OSMOLALITY: Osmolality: 239 mOsm/kg — CL (ref 275–295)

## 2019-02-19 LAB — OSMOLALITY, URINE: Osmolality, Ur: 576 mOsm/kg (ref 300–900)

## 2019-02-19 LAB — SODIUM
Sodium: 114 mmol/L — CL (ref 135–145)
Sodium: 116 mmol/L — CL (ref 135–145)

## 2019-02-19 LAB — HEMOGLOBIN A1C
Hgb A1c MFr Bld: 5.6 % (ref 4.8–5.6)
Mean Plasma Glucose: 114.02 mg/dL

## 2019-02-19 MED ORDER — PNEUMOCOCCAL VAC POLYVALENT 25 MCG/0.5ML IJ INJ: 0.5000 mL | INJECTION | INTRAMUSCULAR | Status: DC

## 2019-02-19 MED ORDER — VITAMIN B-1 100 MG PO TABS
100.0000 mg | ORAL_TABLET | Freq: Every day | ORAL | Status: DC
  Administered 2019-02-20 – 2019-02-21 (×2): 100 mg via ORAL
  Filled 2019-02-19 (×2): qty 1

## 2019-02-19 MED ORDER — SODIUM CHLORIDE 3 % IV SOLN
INTRAVENOUS | Status: DC
  Filled 2019-02-19 (×5): qty 500

## 2019-02-19 MED ORDER — ORAL CARE MOUTH RINSE
15.0000 mL | Freq: Two times a day (BID) | OROMUCOSAL | Status: DC
  Administered 2019-02-19 – 2019-02-25 (×7): 15 mL via OROMUCOSAL

## 2019-02-19 MED ORDER — ENOXAPARIN SODIUM 40 MG/0.4ML ~~LOC~~ SOLN
40.0000 mg | SUBCUTANEOUS | Status: DC
  Administered 2019-02-19 – 2019-02-25 (×7): 40 mg via SUBCUTANEOUS
  Filled 2019-02-19 (×7): qty 0.4

## 2019-02-19 MED ORDER — THIAMINE HCL 100 MG/ML IJ SOLN: 100.0000 mg | Freq: Every day | INTRAMUSCULAR | Status: DC

## 2019-02-19 MED ORDER — SODIUM CHLORIDE 0.9 % IV BOLUS
1000.0000 mL | Freq: Once | INTRAVENOUS | Status: AC
  Administered 2019-02-19: 1000 mL via INTRAVENOUS

## 2019-02-19 MED ORDER — FUROSEMIDE 10 MG/ML IJ SOLN
10.0000 mg/h | INTRAVENOUS | Status: DC
  Administered 2019-02-19: 5 mg/h via INTRAVENOUS
  Administered 2019-02-21: 8 mg/h via INTRAVENOUS
  Administered 2019-02-22 – 2019-02-25 (×4): 10 mg/h via INTRAVENOUS
  Filled 2019-02-19 (×8): qty 25

## 2019-02-19 MED ORDER — ADULT MULTIVITAMIN W/MINERALS CH
1.0000 | ORAL_TABLET | Freq: Every day | ORAL | Status: DC
  Administered 2019-02-20 – 2019-02-26 (×7): 1 via ORAL
  Filled 2019-02-19 (×7): qty 1

## 2019-02-19 MED ORDER — FOLIC ACID 1 MG PO TABS
1.0000 mg | ORAL_TABLET | Freq: Every day | ORAL | Status: DC
  Administered 2019-02-20 – 2019-02-26 (×7): 1 mg via ORAL
  Filled 2019-02-19 (×7): qty 1

## 2019-02-19 MED ORDER — ONDANSETRON HCL 4 MG PO TABS: 4.0000 mg | ORAL_TABLET | Freq: Four times a day (QID) | ORAL | Status: DC | PRN

## 2019-02-19 MED ORDER — CHLORHEXIDINE GLUCONATE CLOTH 2 % EX PADS
6.0000 | MEDICATED_PAD | Freq: Every day | CUTANEOUS | Status: DC
  Administered 2019-02-20: 15:00:00 6 via TOPICAL

## 2019-02-19 MED ORDER — LORAZEPAM 2 MG/ML IJ SOLN: 1.0000 mg | Freq: Four times a day (QID) | INTRAMUSCULAR | Status: AC | PRN

## 2019-02-19 MED ORDER — LORAZEPAM 1 MG PO TABS: 1.0000 mg | ORAL_TABLET | Freq: Four times a day (QID) | ORAL | Status: AC | PRN

## 2019-02-19 MED ORDER — ONDANSETRON HCL 4 MG/2ML IJ SOLN: 4.0000 mg | Freq: Four times a day (QID) | INTRAMUSCULAR | Status: DC | PRN

## 2019-02-19 MED ORDER — HYDRALAZINE HCL 20 MG/ML IJ SOLN: 10.0000 mg | INTRAMUSCULAR | Status: DC | PRN

## 2019-02-19 MED ORDER — ZOLPIDEM TARTRATE 5 MG PO TABS
10.0000 mg | ORAL_TABLET | Freq: Every day | ORAL | Status: DC
  Administered 2019-02-19 – 2019-02-22 (×4): 10 mg via ORAL
  Filled 2019-02-19 (×5): qty 2

## 2019-02-19 NOTE — ED Triage Notes (Signed)
Pt come EMS from home on the third floor after progressive neuropathy has caused him to no longer be able to work. Pt unable to walk on scene. Pt has hx of diabetes, insomnia. Pt AOx4 at this time. Denies any fevers or chills. Endorses some SOB and v/d.

## 2019-02-19 NOTE — Plan of Care (Signed)
Pt admitted to room 4, call bell and phone in reach  A&O x 4, will start lasix gtt, instead of 3% hypertonic saline.  4 liters [place]

## 2019-02-19 NOTE — H&P (Addendum)
[facility] [place] [facility]   PATIENT NAME: Matthew Washington Sava    MR#:  [id number]  DATE OF BIRTH:  Jan 07, 1956  DATE OF ADMISSION:  03/04/2019  PRIMARY CARE PHYSICIAN: Patient, No Pcp Per   REQUESTING/REFERRING PHYSICIAN Daryel NovemberJonathan Williams  CHIEF COMPLAINT:   Generalized weakness and diarrhea HISTORY OF PRESENT ILLNESS:  Matthew Washington Matthew Washington  is a 63 y.o. male with a known history of diabetes mellitus, hypertension, hyperlipidemia and depression is brought into the ED for generalized weakness and unable to walk.  He reports that his lower extremities are swollen and has been experiencing diarrhea for the past 1 week which is watery but no blood.  Denies any nausea vomiting or abdominal pain.  Sodium is at 114 and patient is very lethargic.  CT head is ordered which is pending no seizures noticed.  Patient was initially given IV normal saline but eventually switched over to hypotonic saline.  Hospitalist team is called admit the patient.  PAST MEDICAL HISTORY:   Past Medical History:  Diagnosis Date  . Allergy    seafood  . Broken arm 06/2014  . Diabetes mellitus without complication (HCC)   . Hypertension   . MDD (major depressive disorder)     PAST SURGICAL HISTOIRY:   Past Surgical History:  Procedure Laterality Date  . APPENDECTOMY    . HUMERUS FRACTURE SURGERY Right 06/2014   6 screws and sleeve  . KNEE ARTHROPLASTY Right     SOCIAL HISTORY:   Social History   Tobacco Use  . Smoking status: Current Every Day Smoker  Substance Use Topics  . Alcohol use: No    FAMILY HISTORY:   Family History  Problem Relation Age of Onset  . Diabetes Mother   . Hypertension Mother   . Hypertension Father   . CVA Father     DRUG ALLERGIES:   Allergies  Allergen Reactions  . Shellfish Allergy Shortness Of Breath and Swelling  . Penicillins Hives  . Trazodone Other (See Comments)    REVIEW OF SYSTEMS:  CONSTITUTIONAL: No fever, fatigue or  weakness.  EYES: No blurred or double vision.  EARS, NOSE, AND THROAT: No tinnitus or ear pain.  RESPIRATORY: No cough, shortness of breath, wheezing or hemoptysis.  CARDIOVASCULAR: No chest pain, orthopnea, edema.  GASTROINTESTINAL: Reporting watery diarrhea with no blood ,no nausea, vomiting,abdominal pain.  GENITOURINARY: No dysuria, hematuria.  ENDOCRINE: No polyuria, nocturia,  HEMATOLOGY: No anemia, easy bruising or bleeding SKIN: No rash or lesion. MUSCULOSKELETAL: No joint pain or arthritis.   NEUROLOGIC: No tingling, numbness, weakness.  PSYCHIATRY: No anxiety or depression.   MEDICATIONS AT HOME:   Prior to Admission medications   Medication Sig Start Date End Date Taking? Authorizing Provider  aspirin 325 MG tablet Take 325 mg by mouth daily.    [provider]  FLUoxetine (PROZAC) 40 MG capsule Take by mouth.    [provider]  lisinopril (ZESTRIL) 5 MG tablet Take 5 mg by mouth daily.    [provider]  metFORMIN (GLUCOPHAGE) 500 MG tablet Take 500 mg by mouth 2 (two) times daily.    [provider]  nortriptyline (PAMELOR) 25 MG capsule Take by mouth.    [provider]  simvastatin (ZOCOR) 20 MG tablet Take 20 mg by mouth Nightly.    [provider]      VITAL SIGNS:  Blood pressure (!) 143/90, pulse 95, temperature 97.6 F (36.4 C), temperature source Oral, resp. rate (!) 28,  height 5\' 9"  (1.753 m), weight 75 kg, SpO2 98 %.  PHYSICAL EXAMINATION:  GENERAL:  63 y.o.-year-old patient lying in the bed with no acute distress.  EYES: Pupils equal, round, reactive to light and accommodation. No scleral icterus. Extraocular muscles intact.  HEENT: Head atraumatic, normocephalic. Oropharynx and nasopharynx clear.  NECK:  Supple, no jugular venous distention. No thyroid enlargement, no tenderness.  LUNGS: Normal breath sounds bilaterally, no wheezing, rales,rhonchi or crepitation. No use of accessory muscles of  respiration.  CARDIOVASCULAR: S1, S2 normal. No murmurs, rubs, or gallops.  ABDOMEN: Soft, nontender, nondistended. Bowel sounds present.  EXTREMITIES: 2-3+ pedal edema, no cyanosis, or clubbing.  Positive calf tenderness NEUROLOGIC: Cranial nerves II through XII are intact. Muscle strength global weakness in all extremities. Sensation intact. Gait not checked.  PSYCHIATRIC: The patient is alert and oriented x 3.  SKIN: No obvious rash, lesion, or ulcer.   LABORATORY PANEL:   CBC Recent Labs  Lab 2019-03-15 1429  WBC 8.0  HGB 15.2  HCT 41.0  PLT 155   ------------------------------------------------------------------------------------------------------------------  Chemistries  Recent Labs  Lab 15-Mar-2019 1429  NA 114*  K 3.7  CL 79*  CO2 20*  GLUCOSE 125*  BUN 16  CREATININE 0.91  CALCIUM 8.8*  AST 257*  ALT 189*  ALKPHOS 152*  BILITOT 5.0*   ------------------------------------------------------------------------------------------------------------------  Cardiac Enzymes No results for input(s): TROPONINI in the last 168 hours. ------------------------------------------------------------------------------------------------------------------  RADIOLOGY:  Dg Chest 1 View  Result Date: Mar 15, 2019 CLINICAL DATA:  Dyspnea and nausea vomiting EXAM: CHEST  1 VIEW COMPARISON:  None. FINDINGS: There is mild cardiomegaly. Increased interstitial markings are seen throughout both lungs with pulmonary vascular congestion. There is trace blunting of the left costophrenic angle which could be due to trace pleural effusion. No acute osseous abnormality. IMPRESSION: Pulmonary edema and cardiomegaly. Trace left pleural effusion. Electronically Signed   By: Prudencio Pair M.D.   On: March 15, 2019 14:44    EKG:   Orders placed or performed during the hospital encounter of March 15, 2019  . EKG 12-Lead  . EKG 12-Lead  . ED EKG  . ED EKG    IMPRESSION AND PLAN:    #Severe hyponatremia from  diarrhea and he is fluid overloaded Admit to stepdown unit Received 1 L fluid bolus in the ED  Start him on Lasix GTT as patient is fluid overloaded  serial sodium levels Random urine sodium, serum and urine osmolality Urgent consult placed to nephrology and pulmonology notified via haiku chat Both are aware of the consult  Appreciate nephrology recommendations CT head is ordered which is pending  #Diarrhea Check stool for C. difficile toxin, norovirus and GI panel Hydrate with IV fluids Enteric precautions  #Bilateral lower extremity edema We will get venous Dopplers to rule out DVT Lasix drip  #Diabetes mellitus Sliding scale insulin  #Essential hypertension We will resume statin once medications are reconciled   DVT prophylaxis with Lovenox subcu GI prophylaxis with Protonix    All the records are reviewed and case discussed with ED provider. Management plans discussed with the patient, he is  in agreement.  CODE STATUS: fc   TOTAL CRITICAL CARE TIME TAKING CARE OF THIS PATIENT: 45 minutes.   Note: This dictation was prepared with Dragon dictation along with smaller phrase technology. Any transcriptional errors that result from this process are unintentional.  Nicholes Mango M.D on 03-15-19 at 5:12 PM  Between 7am to 6pm - Pager - [phone]  After 6pm go to [email or web address] - password [email or web address]  [facility]  Office  [phone]  CC: Primary care physician; Patient, No Pcp Per

## 2019-02-19 NOTE — ED Notes (Signed)
Dr. Archie Balboa notified of sodium 114. NS bolus started.

## 2019-02-19 NOTE — Consult Note (Signed)
Name: Matthew Washington. MRN: [id number] DOB: [birth year]    ADMISSION DATE:  03/15/2019 CONSULTATION DATE: 02/19/2019  REFERRING MD : Dr. Margaretmary Eddy   CHIEF COMPLAINT: Inability to Ambulate   BRIEF PATIENT DESCRIPTION:  63 yo male admitted with symptomatic hyponatremia secondary to diarrhea, undiagnosed CHF, and ETOH abuse requiring lasix gtt   SIGNIFICANT EVENTS/STUDIES:  09/2-Pt admitted to the stepdown unit with hyponatremia on lasix gtt   HISTORY OF PRESENT ILLNESS:   This is a 62 yo male with a PMH of Major Depressive Disorder, HTN, Suicidal Ideation,Type II Diabetes Mellitus, Seafood Allergy, and Current Everyday Smoker.  He presented to [facility] ER on 09/3 from home via EMS with an inability to ambulate and worsening neuropathy onset of symptoms 5 days prior to presentation.  It was also reported pt unable to ambulate when EMS arrived on the scene.  Pt also stated he has been unable to work due to worsening neuropathy.  He also endorsed diarrhea (1 stool daily), insomnia, ETOH abuse (drinks 3 shots of liquor daily last alcoholic beverage 00/0), and shortness of breath.  Upon arrival to the ER pt hypoxic with O2 sats 83% on RA, therefore pt placed on 3L O2 via nasal canula (he does not wear chronic O2). CXR concerning for cardiomegaly and pulmonary edema, COVID-19 negative.  Lab results revealed Na+ 114, chloride 79, CO2 20, glucose 125, alk phos 152, AST 257, ALT 189, total bilirubin 5.0, troponin 32, BNP 2,100, PT 22.5, and INR 2.0. He received 1L NS bolus and hypertonic saline initiated at 30 ml/hr.  Pt subsequently admitted to the stepdown unit by hospitalist team for additional workup and treatment.   PAST MEDICAL HISTORY :   has a past medical history of Allergy, Broken arm (06/2014), Diabetes mellitus without complication ([place]), Hypertension, and MDD (major depressive disorder).  has a past surgical history that includes Humerus fracture surgery (Right, 06/2014); Appendectomy; and Knee  Arthroplasty (Right). Prior to Admission medications   Medication Sig Start Date End Date Taking? Authorizing Provider  FLUoxetine (PROZAC) 40 MG capsule Take by mouth.    [provider]  nortriptyline (PAMELOR) 25 MG capsule Take by mouth.    [provider]   Allergies  Allergen Reactions  . Shellfish Allergy Shortness Of Breath and Swelling  . Penicillins Hives  . Trazodone Other (See Comments)    FAMILY HISTORY:  family history includes CVA in his father; Diabetes in his mother; Hypertension in his father and mother. SOCIAL HISTORY:  reports that he has been smoking. He does not have any smokeless tobacco history on file. He reports that he does not drink alcohol or use drugs.  REVIEW OF SYSTEMS: Positives in BOLD  Constitutional: Negative for fever, chills, weight loss, malaise/fatigue and diaphoresis.  HENT: Negative for hearing loss, ear pain, nosebleeds, congestion, sore throat, neck pain, tinnitus and ear discharge.   Eyes: Negative for blurred vision, double vision, photophobia, pain, discharge and redness.  Respiratory: cough, hemoptysis, sputum production, shortness of breath, wheezing and stridor.   Cardiovascular: Negative for chest pain, palpitations, orthopnea, claudication, leg swelling and PND.  Gastrointestinal: Negative for heartburn, nausea, vomiting, abdominal pain, diarrhea, constipation, blood in stool and melena.  Genitourinary: Negative for dysuria, urgency, frequency, hematuria and flank pain.  Musculoskeletal: Negative for myalgias, back pain, joint pain and falls.  Skin: Negative for itching and rash.  Neurological: insomnia, worsening neuropathy, inability to ambulate, dizziness, tingling, tremors, sensory change, speech change, focal weakness, seizures, loss of consciousness, weakness and  headaches.  Endo/Heme/Allergies: Negative for environmental allergies and polydipsia. Does not bruise/bleed easily.  SUBJECTIVE:  c/o mild shortness  of breath   VITAL SIGNS: Temp:  [97.6 F (36.4 C)] 97.6 F (36.4 C) (09/02 1446) Pulse Rate:  [94-104] 94 (09/02 1550) Resp:  [23-26] 23 (09/02 1550) BP: (137-154)/(85-91) 137/91 (09/02 1550) SpO2:  [83 %-98 %] 98 % (09/02 1550) Weight:  [75 kg] 75 kg (09/02 1416)  PHYSICAL EXAMINATION: General: chronically ill appearing male, NAD  Neuro: alert and oriented, follows commands, PERRLA, sclera icterus  HEENT: JVD present Cardiovascular: sinus tach, with BBB, no R/G  Lungs: crackles throughout, slightly labored and tachypneic  Abdomen: hypoactive BS x4, obese, soft, non tender, non distended Musculoskeletal: 2+ bilateral pitting lower extremity edema, 1+ generalized edema  Skin: scattered ecchymosis, left posterior thigh abrasion/scratch    Recent Labs  Lab 03/05/2019 1429  NA 114*  K 3.7  CL 79*  CO2 20*  BUN 16  CREATININE 0.91  GLUCOSE 125*   Recent Labs  Lab 02/25/2019 1429  HGB 15.2  HCT 41.0  WBC 8.0  PLT 155   Dg Chest 1 View  Result Date: 03/05/2019 CLINICAL DATA:  Dyspnea and nausea vomiting EXAM: CHEST  1 VIEW COMPARISON:  None. FINDINGS: There is mild cardiomegaly. Increased interstitial markings are seen throughout both lungs with pulmonary vascular congestion. There is trace blunting of the left costophrenic angle which could be due to trace pleural effusion. No acute osseous abnormality. IMPRESSION: Pulmonary edema and cardiomegaly. Trace left pleural effusion. Electronically Signed   By: Prudencio Pair M.D.   On: 03/14/2019 14:44    ASSESSMENT / PLAN:  Acute hypoxic respiratory failure secondary to pulmonary edema  Hx: Current everyday smoker  Supplemental O2 for dyspnea and/or hypoxia   Prn bronchodilator therapy  Venous US bilateral lower extremities pending to r/o DVT  Smoking cessation counseling provided  Lasix gtt _0  ml/hr   Elevated troponin likely secondary to demand ischemia in setting of acute respiratory failure and severe hyponatremia  Fluid  overload likely secondary to undiagnosed CHF  Hypertension  Continuous telemetry monitoring  Trend troponin's  Echo pending  Prn hydralazine for bp management  Continue outpatient simvastatin   Hyponatremia and Hypochloridemia likely secondary to diarrhea, undiagnosed CHF, and ETOH abuse  Continuous telemetry monitoring  Nephrology consulted appreciate input  Urine osmolality, Serum osmolality, and Urine sodium pending  Neuro checks q4hrs Trend BMP  Replace electrolytes as indicated  Monitor UOP Lasix gtt _1  ml/hr  1200 ml fluid restriction  Continue seizure precautions  Cdiff results pending Trend WBC and monitor fever curve   Elevated liver enzymes secondary to ETOH abuse  Trend hepatic panel   Type II Diabetes Mellitus-stable  CBG ac/hs  Hemoglobin A1c pending   ETOH Abuse  Hx: Depression  CIWA protocol initiated  Will hold outpatient prozac and nortriptyline for now  ETOH cessation counseling provided   VTE px: subq lovenox   -Once pts respiratory status and sodium levels stabilize will consult physical therapy due to generalized weakness and pts hx of inability to ambulate   Marda Stalker, [place] Pager [phone] (please enter 7 digits) PCCM Consult Pager [phone] (please enter 7 digits)

## 2019-02-19 NOTE — ED Notes (Signed)
ED TO INPATIENT HANDOFF REPORT  ED Nurse Name and Phone #: Shanda Bumpsjessica [phone]  S Name/Age/Gender Matthew Miresavid Harold Yount Jr. 63 y.o. male Room/Bed: ED22A/ED22AA  Code Status   Code Status: Not on file  Home/SNF/Other unknown Patient oriented to: self, place, time and situation Is this baseline? Yes   Triage Complete: Triage complete  Chief Complaint can't walk  Triage Note Pt come EMS from home on the third floor after progressive neuropathy has caused him to no longer be able to work. Pt unable to walk on scene. Pt has hx of diabetes, insomnia. Pt AOx4 at this time. Denies any fevers or chills. Endorses some SOB and v/d.    Allergies Allergies  Allergen Reactions  . Shellfish Allergy Shortness Of Breath and Swelling  . Penicillins Hives  . Trazodone Other (See Comments)    Level of Care/Admitting Diagnosis ED Disposition    ED Disposition Condition Comment   Admit  [facility] [[id number]]  Level of Care: Stepdown [14]  Covid Evaluation: Confirmed COVID Negative  Diagnosis: Acute hyponatremia [960454][[id number]]  Admitting Physician: Ramonita LabGOURU, ARUNA [[id number]]  Attending Physician: Ramonita LabGOURU, ARUNA [[id number]]  Estimated length of stay: 3 - 4 days  Certification:: I certify this patient will need inpatient services for at least 2 midnights  PT Class (Do Not Modify): Inpatient [101]  PT Acc Code (Do Not Modify): Private [1]       B Medical/Surgery History Past Medical History:  Diagnosis Date  . Allergy    seafood  . Broken arm 06/2014  . Diabetes mellitus without complication ([facility])   . Hypertension   . MDD (major depressive disorder)    Past Surgical History:  Procedure Laterality Date  . APPENDECTOMY    . HUMERUS FRACTURE SURGERY Right 06/2014   6 screws and sleeve  . KNEE ARTHROPLASTY Right      A IV Location/Drains/Wounds Patient Lines/Drains/Airways Status   Active Line/Drains/Airways    Name:   Placement date:   Placement time:   Site:    Days:   Peripheral IV 03/12/2019 Right Antecubital   03/12/2019    1523    Antecubital   less than 1          Intake/Output Last 24 hours  Intake/Output Summary (Last 24 hours) at  1645 Last data filed at 02/18/2019 1621 Gross per 24 hour  Intake 206.28 ml  Output -  Net 206.28 ml    Labs/Imaging Results for orders placed or performed during the hospital encounter of 02/25/2019 (from the past 48 hour(s))  CBC with Differential     Status: Abnormal   Collection Time: 03/15/2019  2:29 PM  Result Value Ref Range   WBC 8.0 4.0 - 10.5 K/uL   RBC 4.62 4.22 - 5.81 MIL/uL   Hemoglobin 15.2 13.0 - 17.0 g/dL   HCT 09.841.0 11.939.0 - 14.752.0 %   MCV 88.7 80.0 - 100.0 fL   MCH 32.9 26.0 - 34.0 pg   MCHC 37.1 (H) 30.0 - 36.0 g/dL   RDW 82.914.2 56.211.5 - 13.015.5 %   Platelets 155 150 - 400 K/uL   nRBC 0.0 0.0 - 0.2 %   Neutrophils Relative % 84 %   Neutro Abs 6.7 1.7 - 7.7 K/uL   Lymphocytes Relative 7 %   Lymphs Abs 0.5 (L) 0.7 - 4.0 K/uL   Monocytes Relative 8 %   Monocytes Absolute 0.6 0.1 - 1.0 K/uL   Eosinophils Relative 0 %   Eosinophils Absolute 0.0 0.0 -  0.5 K/uL   Basophils Relative 0 %   Basophils Absolute 0.0 0.0 - 0.1 K/uL   Immature Granulocytes 1 %   Abs Immature Granulocytes 0.05 0.00 - 0.07 K/uL    Comment: Performed at [facility], [address] [place], [place] [address]  Comprehensive metabolic panel     Status: Abnormal   Collection Time: 03/11/2019  2:29 PM  Result Value Ref Range   Sodium 114 (LL) 135 - 145 mmol/L    Comment: CRITICAL RESULT CALLED TO, READ BACK BY AND VERIFIED WITH JESSICA Tiare Rohlman @1527  03/08/2019 MJU    Potassium 3.7 3.5 - 5.1 mmol/L   Chloride 79 (L) 98 - 111 mmol/L   CO2 20 (L) 22 - 32 mmol/L   Glucose, Bld 125 (H) 70 - 99 mg/dL   BUN 16 8 - 23 mg/dL   Creatinine, Ser 6.04 0.61 - 1.24 mg/dL   Calcium 8.8 (L) 8.9 - 10.3 mg/dL   Total Protein 7.5 6.5 - 8.1 g/dL   Albumin 3.4 (L) 3.5 - 5.0 g/dL   AST 540 (H) 15 - 41 U/L   ALT 189 (H) 0 - 44  U/L   Alkaline Phosphatase 152 (H) 38 - 126 U/L   Total Bilirubin 5.0 (H) 0.3 - 1.2 mg/dL   GFR calc non Af Amer >60 >60 mL/min   GFR calc Af Amer >60 >60 mL/min   Anion gap 15 5 - 15    Comment: Performed at [facility], [address], [place], [place] [address]  Troponin I (High Sensitivity)     Status: Abnormal   Collection Time: 03/16/2019  2:29 PM  Result Value Ref Range   Troponin I (High Sensitivity) 32 (H) <18 ng/L    Comment: (NOTE) Elevated high sensitivity troponin I (hsTnI) values and significant  changes across serial measurements may suggest ACS but many other  chronic and acute conditions are known to elevate hsTnI results.  Refer to the "Links" section for chest pain algorithms and additional  guidance. Performed at [facility], [address] [place], [place] [address]   Protime-INR     Status: Abnormal   Collection Time: 03/04/2019  2:29 PM  Result Value Ref Range   Prothrombin Time 22.5 (H) 11.4 - 15.2 seconds   INR 2.0 (H) 0.8 - 1.2    Comment: (NOTE) INR goal varies based on device and disease states. Performed at [facility], [address] [place], [place] [address]   Blood gas, venous     Status: Abnormal   Collection Time: 03/03/2019  2:29 PM  Result Value Ref Range   pH, Ven 7.46 (H) 7.250 - 7.430   pCO2, Ven 35 (L) 44.0 - 60.0 mmHg   pO2, Ven 35.0 32.0 - 45.0 mmHg   Bicarbonate 24.9 20.0 - 28.0 mmol/L   Acid-Base Excess 1.4 0.0 - 2.0 mmol/L   O2 Saturation 71.4 %   Patient temperature 37.0    Collection site VEIN    Sample type VEIN     Comment: Performed at [facility], [address] [place], [place] [address]  Brain natriuretic peptide     Status: Abnormal   Collection Time: 02/23/2019  2:31 PM  Result Value Ref Range   B Natriuretic Peptide 2,100.0 (H) 0.0 - 100.0 pg/mL    Comment: Performed at [facility], [address] [place], [place] [address]  Urine Drug Screen, Qualitative ([facility]  only)     Status: None  Collection Time: March 08, 2019  2:32 PM  Result Value Ref Range   Tricyclic, Ur Screen NONE DETECTED NONE DETECTED   Amphetamines, Ur Screen NONE DETECTED NONE DETECTED   MDMA (Ecstasy)Ur Screen NONE DETECTED NONE DETECTED   Cocaine Metabolite,Ur [place] NONE DETECTED NONE DETECTED   Opiate, Ur Screen NONE DETECTED NONE DETECTED   Phencyclidine (PCP) Ur S NONE DETECTED NONE DETECTED   Cannabinoid 50 Ng, Ur [place] NONE DETECTED NONE DETECTED   Barbiturates, Ur Screen NONE DETECTED NONE DETECTED   Benzodiazepine, Ur Scrn NONE DETECTED NONE DETECTED   Methadone Scn, Ur NONE DETECTED NONE DETECTED    Comment: (NOTE) Tricyclics + metabolites, urine    Cutoff 1000 ng/mL Amphetamines + metabolites, urine  Cutoff 1000 ng/mL MDMA (Ecstasy), urine              Cutoff 500 ng/mL Cocaine Metabolite, urine          Cutoff 300 ng/mL Opiate + metabolites, urine        Cutoff 300 ng/mL Phencyclidine (PCP), urine         Cutoff 25 ng/mL Cannabinoid, urine                 Cutoff 50 ng/mL Barbiturates + metabolites, urine  Cutoff 200 ng/mL Benzodiazepine, urine              Cutoff 200 ng/mL Methadone, urine                   Cutoff 300 ng/mL The urine drug screen provides only a preliminary, unconfirmed analytical test result and should not be used for non-medical purposes. Clinical consideration and professional judgment should be applied to any positive drug screen result due to possible interfering substances. A more specific alternate chemical method must be used in order to obtain a confirmed analytical result. Gas chromatography / mass spectrometry (GC/MS) is the preferred confirmat ory method. Performed at [facility], [address] [place], [place] [address]   Urinalysis, Complete w Microscopic     Status: Abnormal   Collection Time: 03/08/19  2:33 PM  Result Value Ref Range   Color, Urine AMBER (A) YELLOW    Comment: BIOCHEMICALS MAY BE AFFECTED BY COLOR    APPearance HAZY (A) CLEAR   Specific Gravity, Urine 1.024 1.005 - 1.030   pH 5.0 5.0 - 8.0   Glucose, UA NEGATIVE NEGATIVE mg/dL   Hgb urine dipstick NEGATIVE NEGATIVE   Bilirubin Urine MODERATE (A) NEGATIVE   Ketones, ur 5 (A) NEGATIVE mg/dL   Protein, ur 883 (A) NEGATIVE mg/dL   Nitrite NEGATIVE NEGATIVE   Leukocytes,Ua NEGATIVE NEGATIVE   RBC / HPF 0-5 0 - 5 RBC/hpf   WBC, UA 0-5 0 - 5 WBC/hpf   Bacteria, UA RARE (A) NONE SEEN   Squamous Epithelial / LPF NONE SEEN 0 - 5   Mucus PRESENT    Hyaline Casts, UA PRESENT    Sperm, UA PRESENT     Comment: Performed at [facility], [address], [place], [place] [address]  Ethanol     Status: None   Collection Time: Mar 08, 2019  2:33 PM  Result Value Ref Range   Alcohol, Ethyl (B) <10 <10 mg/dL    Comment: (NOTE) Lowest detectable limit for serum alcohol is 10 mg/dL. For medical purposes only. Performed at [facility], [address] [place], [place] [address]   SARS Coronavirus 2 [facility] order, Performed in [facility] hospital lab) Nasopharyngeal Nasopharyngeal Swab  Status: None   Collection Time: 02/28/2019  2:33 PM   Specimen: Nasopharyngeal Swab  Result Value Ref Range   SARS Coronavirus 2 NEGATIVE NEGATIVE    Comment: (NOTE) If result is NEGATIVE SARS-CoV-2 target nucleic acids are NOT DETECTED. The SARS-CoV-2 RNA is generally detectable in upper and lower  respiratory specimens during the acute phase of infection. The lowest  concentration of SARS-CoV-2 viral copies this assay can detect is 250  copies / mL. A negative result does not preclude SARS-CoV-2 infection  and should not be used as the sole basis for treatment or other  patient management decisions.  A negative result may occur with  improper specimen collection / handling, submission of specimen other  than nasopharyngeal swab, presence of viral mutation(s) within the  areas targeted by this assay, and inadequate number of viral copies   (<250 copies / mL). A negative result must be combined with clinical  observations, patient history, and epidemiological information. If result is POSITIVE SARS-CoV-2 target nucleic acids are DETECTED. The SARS-CoV-2 RNA is generally detectable in upper and lower  respiratory specimens dur ing the acute phase of infection.  Positive  results are indicative of active infection with SARS-CoV-2.  Clinical  correlation with patient history and other diagnostic information is  necessary to determine patient infection status.  Positive results do  not rule out bacterial infection or co-infection with other viruses. If result is PRESUMPTIVE POSTIVE SARS-CoV-2 nucleic acids MAY BE PRESENT.   A presumptive positive result was obtained on the submitted specimen  and confirmed on repeat testing.  While 2019 novel coronavirus  (SARS-CoV-2) nucleic acids may be present in the submitted sample  additional confirmatory testing may be necessary for epidemiological  and / or clinical management purposes  to differentiate between  SARS-CoV-2 and other Sarbecovirus currently known to infect humans.  If clinically indicated additional testing with an alternate test  methodology [phone]([id number]) is advised. The SARS-CoV-2 RNA is generally  detectable in upper and lower respiratory sp ecimens during the acute  phase of infection. The expected result is Negative. Fact Sheet for Patients:  BoilerBrush.com.cyhttps://www.fda.gov/media/136312/download Fact Sheet for Healthcare Providers: https://pope.com/https://www.fda.gov/media/136313/download This test is not yet approved or cleared by the [place] FDA and has been authorized for detection and/or diagnosis of SARS-CoV-2 by FDA under an Emergency Use Authorization (EUA).  This EUA will remain in effect (meaning this test can be used) for the duration of the COVID-19 declaration under Section 564(b)(1) of the Act, 21 U.S.C. section 360bbb-3(b)(1), unless the authorization is terminated  or revoked sooner. Performed at [facility], [address] [place], [place] [id number]    Dg Chest 1 View  Result Date: 03/14/2019 CLINICAL DATA:  Dyspnea and nausea vomiting EXAM: CHEST  1 VIEW COMPARISON:  None. FINDINGS: There is mild cardiomegaly. Increased interstitial markings are seen throughout both lungs with pulmonary vascular congestion. There is trace blunting of the left costophrenic angle which could be due to trace pleural effusion. No acute osseous abnormality. IMPRESSION: Pulmonary edema and cardiomegaly. Trace left pleural effusion. Electronically Signed   By: Jonna ClarkBindu  Avutu M.D.   On: 03/04/2019 14:44    Pending Labs Unresulted Labs (From admission, onward)   None      Vitals/Pain Today's Vitals   03/10/2019 1422 03/03/2019 1446 03/14/2019 1501 02/18/2019 1550  BP: (!) 154/85   (!) 137/91  Pulse: (!) 104   94  Resp: (!) 26   (!) 23  Temp:  97.6 F (36.4 C)  TempSrc:  Oral    SpO2: 96%  98% 98%  Weight:      Height:      PainSc:        Isolation Precautions No active isolations  Medications Medications  sodium chloride (hypertonic) 3 % solution (has no administration in time range)  sodium chloride 0.9 % bolus 1,000 mL ( Intravenous Rate/Dose Verify 02/24/2019 1621)    Mobility non-ambulatory High fall risk   Focused Assessments Pulmonary Assessment Handoff:  Lung sounds: Bilateral Breath Sounds: Diminished, Expiratory wheezes L Breath Sounds: Diminished R Breath Sounds: Diminished O2 Device: Nasal Cannula O2 Flow Rate (L/min): 3 L/min      R Recommendations: See Admitting Provider Note  Report given to:   Additional Notes:

## 2019-02-19 NOTE — ED Notes (Signed)
Pt to remain on NS until 3% saline comes from pharmacy per Dr. Archie Balboa.

## 2019-02-19 NOTE — ED Notes (Signed)
Patient transported to CT 

## 2019-02-19 NOTE — ED Provider Notes (Signed)
[facility] Emergency Department Provider Note       Time seen: ----------------------------------------- 2:12 PM on 03/04/2019 -----------------------------------------   I have reviewed the triage vital signs and the nursing notes.  HISTORY   Chief Complaint No chief complaint on file.    HPI Matthew MiresDavid Harold Wich Jr. is a 63 y.o. male with a history of diabetes, hypertension, depression who presents to the ED for severe neuropathy and inability to walk.  Patient arrives from home where he lives on third floor apartment.  He has progressive neuropathy that causes him no longer to be able to work or walk.  Patient states he also has insomnia, has had some shortness of breath as well, was hypoxic in route.  Patient states he has never needed oxygen before.  Past Medical History:  Diagnosis Date  . Allergy    seafood  . Broken arm 06/2014  . Diabetes mellitus without complication (HCC)   . Hypertension   . MDD (major depressive disorder)     Patient Active Problem List   Diagnosis Date Noted  . Peripheral neuropathy 03/26/2014  . Diabetes (HCC) 03/04/2014  . Hypertension 03/04/2014  . Hyperlipidemia 03/04/2014  . Depression 03/04/2014    Allergies Shellfish allergy, Penicillins, and Trazodone  Social History Social History   Tobacco Use  . Smoking status: Current Every Day Smoker  Substance Use Topics  . Alcohol use: No  . Drug use: No    Review of Systems Constitutional: Negative for fever. Cardiovascular: Negative for chest pain. Respiratory: Positive for shortness of breath Gastrointestinal: Negative for abdominal pain, vomiting and diarrhea. Musculoskeletal: Negative for back pain. Skin: Negative for rash. Neurological: Positive for generalized weakness  All systems negative/normal/unremarkable except as stated in the HPI  ____________________________________________   PHYSICAL EXAM:  VITAL SIGNS: ED Triage Vitals  Enc  Vitals Group     BP      Pulse      Resp      Temp      Temp src      SpO2      Weight      Height      Head Circumference      Peak Flow      Pain Score      Pain Loc      Pain Edu?      Excl. in GC?     Constitutional: Alert and oriented.  Chronically ill-appearing, mild distress Eyes: Conjunctivae are normal. Normal extraocular movements. ENT      Head: Normocephalic and atraumatic.      Nose: No congestion/rhinnorhea.      Mouth/Throat: Mucous membranes are moist.      Neck: No stridor. Cardiovascular: Rapid rate, regular rhythm. No murmurs, rubs, or gallops.  Good distal pulses Respiratory: Normal respiratory effort without tachypnea nor retractions. Breath sounds are clear and equal bilaterally. No wheezes/rales/rhonchi. Gastrointestinal: Soft and nontender.  Ascites is noted Musculoskeletal: Nontender with normal range of motion in extremities.  Pitting edema of the lower extremities, left greater than right. Neurologic:  Normal speech and language. No gross focal neurologic deficits are appreciated.  Generalized weakness Skin:  Skin is warm, dry and intact.  Scattered bruising is noted Psychiatric: Mood and affect are normal.  ____________________________________________  EKG: Interpreted by me.  Sinus tachycardia with a rate of 105 bpm, wide QRS, normal axis, normal QT  ____________________________________________  ED COURSE:  As part of my medical decision making, I reviewed the following data within the  electronic MEDICAL RECORD NUMBER History obtained from family if available, nursing notes, old chart and ekg, as well as notes from prior ED visits. Patient presented for weakness, inability to walk and hypoxia, we will assess with labs and imaging as indicated at this time.   Procedures  Matthew Kick. was evaluated in Emergency Department on 03-19-2019 for the symptoms described in the history of present illness. He was evaluated in the context of the global  COVID-19 pandemic, which necessitated consideration that the patient might be at risk for infection with the SARS-CoV-2 virus that causes COVID-19. Institutional protocols and algorithms that pertain to the evaluation of patients at risk for COVID-19 are in a state of rapid change based on information released by regulatory bodies including the CDC and federal and state organizations. These policies and algorithms were followed during the patient's care in the ED.  ____________________________________________   LABS (pertinent positives/negatives)  Labs Reviewed  CBC WITH DIFFERENTIAL/PLATELET - Abnormal; Notable for the following components:      Result Value   MCHC 37.1 (*)    Lymphs Abs 0.5 (*)    All other components within normal limits  SARS CORONAVIRUS 2 (HOSPITAL ORDER, [place] [facility])  COMPREHENSIVE METABOLIC PANEL  URINALYSIS, COMPLETE (UACMP) WITH MICROSCOPIC  PROTIME-INR  BRAIN NATRIURETIC PEPTIDE  ETHANOL  URINE DRUG SCREEN, QUALITATIVE ([facility] ONLY)  BLOOD GAS, VENOUS  TROPONIN I (HIGH SENSITIVITY)    RADIOLOGY Images were viewed by me  Chest x-ray IMPRESSION:  Pulmonary edema and cardiomegaly.   Trace left pleural effusion.  ____________________________________________   DIFFERENTIAL DIAGNOSIS   Pneumonia, CHF, MI, cirrhosis, alcohol abuse, dehydration, electrolyte abnormality, sepsis  FINAL ASSESSMENT AND PLAN  Weakness, hypoxia, pulmonary edema   Plan: The patient had presented for weakness, neuropathy and hypoxia. Patient's labs are still pending at this time. Patient's imaging revealed pulmonary edema and cardiomegaly   Laurence Aly, MD    Note: This note was generated in part or whole with voice recognition software. Voice recognition is usually quite accurate but there are transcription errors that can and very often do occur. I apologize for any typographical errors that were not detected and corrected.     Earleen Newport, MD 19-Mar-2019 [phone]

## 2019-02-20 ENCOUNTER — Inpatient Hospital Stay: Payer: Medicaid Other

## 2019-02-20 ENCOUNTER — Inpatient Hospital Stay: Admit: 2019-02-20 | Payer: Medicaid Other

## 2019-02-20 ENCOUNTER — Inpatient Hospital Stay
Admit: 2019-02-20 | Discharge: 2019-02-20 | Disposition: A | Discharge status: Home / Self Care | Payer: Medicaid Other | Attending: Critical Care Medicine | Admitting: Critical Care Medicine

## 2019-02-20 DIAGNOSIS — J810 Acute pulmonary edema: Secondary | ICD-10-CM

## 2019-02-20 DIAGNOSIS — E871 Hypo-osmolality and hyponatremia: Principal | ICD-10-CM

## 2019-02-20 LAB — COMPREHENSIVE METABOLIC PANEL
ALT: 136 U/L — ABNORMAL HIGH (ref 0–44)
AST: 155 U/L — ABNORMAL HIGH (ref 15–41)
Albumin: 3 g/dL — ABNORMAL LOW (ref 3.5–5.0)
Alkaline Phosphatase: 133 U/L — ABNORMAL HIGH (ref 38–126)
Anion gap: 14 (ref 5–15)
BUN: 14 mg/dL (ref 8–23)
CO2: 21 mmol/L — ABNORMAL LOW (ref 22–32)
Calcium: 8.4 mg/dL — ABNORMAL LOW (ref 8.9–10.3)
Chloride: 82 mmol/L — ABNORMAL LOW (ref 98–111)
Creatinine, Ser: 0.72 mg/dL (ref 0.61–1.24)
GFR calc Af Amer: >60 mL/min (ref >60)
GFR calc non Af Amer: >60 mL/min (ref >60)
Glucose, Bld: 111 mg/dL — ABNORMAL HIGH (ref 70–99)
Potassium: 3.3 mmol/L — ABNORMAL LOW (ref 3.5–5.1)
Sodium: 117 mmol/L — CL (ref 135–145)
Total Bilirubin: 4.5 mg/dL — ABNORMAL HIGH (ref 0.3–1.2)
Total Protein: 6.6 g/dL (ref 6.5–8.1)

## 2019-02-20 LAB — CBC WITH DIFFERENTIAL/PLATELET
Abs Immature Granulocytes: 0.04 10*3/uL (ref 0.00–0.07)
Basophils Absolute: 0 10*3/uL (ref 0.0–0.1)
Basophils Relative: 0 %
Eosinophils Absolute: 0 10*3/uL (ref 0.0–0.5)
Eosinophils Relative: 0 %
HCT: 38.4 % — ABNORMAL LOW (ref 39.0–52.0)
Hemoglobin: 14.3 g/dL (ref 13.0–17.0)
Immature Granulocytes: 1 %
Lymphocytes Relative: 6 %
Lymphs Abs: 0.5 10*3/uL — ABNORMAL LOW (ref 0.7–4.0)
MCH: 32.8 pg (ref 26.0–34.0)
MCHC: 37.2 g/dL — ABNORMAL HIGH (ref 30.0–36.0)
MCV: 88.1 fL (ref 80.0–100.0)
Monocytes Absolute: 0.5 10*3/uL (ref 0.1–1.0)
Monocytes Relative: 6 %
Neutro Abs: 6.7 10*3/uL (ref 1.7–7.7)
Neutrophils Relative %: 87 %
Platelets: 150 10*3/uL (ref 150–400)
RBC: 4.36 MIL/uL (ref 4.22–5.81)
RDW: 14 % (ref 11.5–15.5)
WBC: 7.7 10*3/uL (ref 4.0–10.5)
nRBC: 0 % (ref 0.0–0.2)

## 2019-02-20 LAB — SODIUM
Sodium: 116 mmol/L — CL (ref 135–145)
Sodium: 118 mmol/L — CL (ref 135–145)
Sodium: 117 mmol/L — CL (ref 135–145)
Sodium: 118 mmol/L — CL (ref 135–145)
Sodium: 118 mmol/L — CL (ref 135–145)

## 2019-02-20 LAB — PHOSPHORUS: Phosphorus: 3.3 mg/dL (ref 2.5–4.6)

## 2019-02-20 LAB — BRAIN NATRIURETIC PEPTIDE: B Natriuretic Peptide: 3158 pg/mL — ABNORMAL HIGH (ref 0.0–100.0)

## 2019-02-20 LAB — POTASSIUM: Potassium: 3.7 mmol/L (ref 3.5–5.1)

## 2019-02-20 LAB — GLUCOSE, CAPILLARY: Glucose-Capillary: 106 mg/dL — ABNORMAL HIGH (ref 70–99)

## 2019-02-20 LAB — MAGNESIUM: Magnesium: 1.4 mg/dL — ABNORMAL LOW (ref 1.7–2.4)

## 2019-02-20 MED ORDER — POTASSIUM CHLORIDE CRYS ER 20 MEQ PO TBCR
40.0000 meq | EXTENDED_RELEASE_TABLET | Freq: Once | ORAL | Status: AC
  Administered 2019-02-20: 40 meq via ORAL
  Filled 2019-02-20: qty 2

## 2019-02-20 MED ORDER — MAGNESIUM SULFATE 4 GM/100ML IV SOLN
4.0000 g | Freq: Once | INTRAVENOUS | Status: AC
  Administered 2019-02-20: 09:00:00 4 g via INTRAVENOUS
  Filled 2019-02-20: qty 100

## 2019-02-20 MED ORDER — ALBUMIN HUMAN 25 % IV SOLN
25.0000 g | Freq: Two times a day (BID) | INTRAVENOUS | Status: DC
  Administered 2019-02-20 – 2019-02-24 (×9): 25 g via INTRAVENOUS
  Administered 2019-02-24: 12.5 g via INTRAVENOUS
  Filled 2019-02-20 (×2): qty 100
  Filled 2019-02-20 (×4): qty 50
  Filled 2019-02-20: qty 100
  Filled 2019-02-20: qty 50
  Filled 2019-02-20 (×3): qty 100

## 2019-02-20 MED ORDER — POTASSIUM CHLORIDE 10 MEQ/100ML IV SOLN
10.0000 meq | INTRAVENOUS | Status: AC
  Administered 2019-02-20 (×3): 10 meq via INTRAVENOUS
  Filled 2019-02-20 (×3): qty 100

## 2019-02-20 MED ORDER — MAGNESIUM SULFATE IN D5W 1-5 GM/100ML-% IV SOLN
1.0000 g | Freq: Once | INTRAVENOUS | Status: DC
  Filled 2019-02-20: qty 100

## 2019-02-20 MED ORDER — POTASSIUM CHLORIDE CRYS ER 20 MEQ PO TBCR
40.0000 meq | EXTENDED_RELEASE_TABLET | Freq: Once | ORAL | Status: AC
  Administered 2019-02-20: 09:00:00 40 meq via ORAL
  Filled 2019-02-20: qty 2

## 2019-02-20 NOTE — Progress Notes (Signed)
Report given to [facility], RN, care handed off at this time.

## 2019-02-20 NOTE — Consult Note (Signed)
[facility] CONSULT NOTE    Date: 02/20/2019                  Patient Name:  Matthew MiresDavid Harold Eisenhardt Jr.  MRN: [id number]  DOB: Dec 21, 1955  Age / Sex: 63 y.o., male         PCP: Patient, No Pcp Per                 Service Requesting Consult:  Hospitalist                 Reason for Consult:  Hyponatremia            History of Present Illness: Patient is a 63 y.o. male with a PMHx of diabetes mellitus type 2, hypertension, depression, hyperlipidemia, who was [facility]/2019 for evaluation of generalized weakness, shortness of breath, and signs of volume overload.  Patient lethargic at initial visit and unable to offer accurate history.  Per review of records it appears that he has been having increasing lower extremity edema which led to lower extremity weakness.  He did also endorse shortness of breath when he was awake.  Chest x-ray revealed vascular congestion as well as pulmonary edema.  We were consulted and advised critical care to start the patient on Lasix drip given his volume overload and hyponatremia.  Admitting sodium was 114 and now up to 117.  2D echocardiogram still pending at the moment.   Medications: Outpatient medications: Medications Prior to Admission  Medication Sig Dispense Refill Last Dose  . FLUoxetine (PROZAC) 40 MG capsule Take by mouth.   03/14/2019 at 0900  . zolpidem (AMBIEN) 10 MG tablet Take 10 mg by mouth at bedtime.   02/18/2019 at 2000  . aspirin 325 MG tablet Take 325 mg by mouth daily.   Not Taking at Unknown time  . lisinopril (ZESTRIL) 5 MG tablet Take 5 mg by mouth daily.   Not Taking at Unknown time  . metFORMIN (GLUCOPHAGE) 500 MG tablet Take 500 mg by mouth 2 (two) times daily.   Not Taking at Unknown time  . nortriptyline (PAMELOR) 25 MG capsule Take by mouth.   Not Taking at Unknown time  . simvastatin (ZOCOR) 20 MG tablet Take 20 mg by mouth Nightly.   Not Taking at Unknown time    Current medications: Current  Facility-Administered Medications  Medication Dose Route Frequency Provider Last Rate Last Dose  . albumin human 25 % solution 25 g  25 g Intravenous Q12H Tam Delisle, MD      . Chlorhexidine Gluconate Cloth 2 % PADS 6 each  6 each Topical Daily Kasa, Kurian, MD      . enoxaparin (LOVENOX) injection 40 mg  40 mg Subcutaneous Q24H Gouru, Aruna, MD   40 mg at 09-09-18 2231  . folic acid (FOLVITE) tablet 1 mg  1 mg Oral Daily Eugenie NorrieBlakeney, Dana G, NP      . furosemide (LASIX) 250 mg in dextrose 5 % 250 mL (1 mg/mL) infusion  8 mg/hr Intravenous Continuous Mohamedamin Nifong, MD 5 mL/hr at 02/20/19 0626 5 mg/hr at 02/20/19 0626  . hydrALAZINE (APRESOLINE) injection 10-20 mg  10-20 mg Intravenous Q4H PRN Eugenie NorrieBlakeney, Dana G, NP      . LORazepam (ATIVAN) tablet 1 mg  1 mg Oral Q6H PRN Eugenie NorrieBlakeney, Dana G, NP       Or  . LORazepam (ATIVAN) injection 1 mg  1 mg Intravenous Q6H PRN Eugenie NorrieBlakeney, Dana G, NP      .  magnesium sulfate IVPB 4 g 100 mL  4 g Intravenous Once Erin Fulling, MD 50 mL/hr at 02/20/19 0854 4 g at 02/20/19 0854  . MEDLINE mouth rinse  15 mL Mouth Rinse BID Erin Fulling, MD   15 mL at 03/05/2019 2232  . multivitamin with minerals tablet 1 tablet  1 tablet Oral Daily Eugenie Norrie, NP      . ondansetron (ZOFRAN) tablet 4 mg  4 mg Oral Q6H PRN Gouru, Aruna, MD       Or  . ondansetron (ZOFRAN) injection 4 mg  4 mg Intravenous Q6H PRN Gouru, Aruna, MD      . pneumococcal 23 valent vaccine (PNU-IMMUNE) injection 0.5 mL  0.5 mL Intramuscular Tomorrow-1000 Kasa, Kurian, MD      . potassium chloride 10 mEq in 100 mL IVPB  10 mEq Intravenous Q1 Hr x 3 Sayde Lish, MD      . sodium chloride (hypertonic) 3 % solution   Intravenous Continuous Gouru, Aruna, MD      . thiamine (VITAMIN B-1) tablet 100 mg  100 mg Oral Daily Eugenie Norrie, NP       Or  . thiamine (B-1) injection 100 mg  100 mg Intravenous Daily Eugenie Norrie, NP      . zolpidem (AMBIEN) tablet 10 mg  10 mg Oral QHS Harlon Ditty D,  NP   10 mg at 03/06/2019 2351      Allergies: Allergies  Allergen Reactions  . Shellfish Allergy Shortness Of Breath and Swelling  . Penicillins Hives  . Trazodone Other (See Comments)      Past Medical History: Past Medical History:  Diagnosis Date  . Allergy    seafood  . Broken arm 06/2014  . Diabetes mellitus without complication (HCC)   . Hypertension   . MDD (major depressive disorder)      Past Surgical History: Past Surgical History:  Procedure Laterality Date  . APPENDECTOMY    . HUMERUS FRACTURE SURGERY Right 06/2014   6 screws and sleeve  . KNEE ARTHROPLASTY Right      Family History: Family History  Problem Relation Age of Onset  . Diabetes Mother   . Hypertension Mother   . Hypertension Father   . CVA Father      Social History: Social History   Socioeconomic History  . Marital status: Single    Spouse name: Not on file  . Number of children: Not on file  . Years of education: Not on file  . Highest education level: Not on file  Occupational History  . Not on file  Social Needs  . Financial resource strain: Not on file  . Food insecurity    Worry: Not on file    Inability: Not on file  . Transportation needs    Medical: Not on file    Non-medical: Not on file  Tobacco Use  . Smoking status: Current Every Day Smoker    Packs/day: 1.00  . Smokeless tobacco: Never Used  Substance and Sexual Activity  . Alcohol use: Yes    Alcohol/week: 21.0 standard drinks    Types: 21 Shots of liquor per week  . Drug use: No  . Sexual activity: Not Currently  Lifestyle  . Physical activity    Days per week: Not on file    Minutes per session: Not on file  . Stress: Not on file  Relationships  . Social connections    Talks on phone: Not on file  Gets together: Not on file    Attends religious service: Not on file    Active member of club or organization: Not on file    Attends meetings of clubs or organizations: Not on file     Relationship status: Not on file  . Intimate partner violence    Fear of current or ex partner: Not on file    Emotionally abused: Not on file    Physically abused: Not on file    Forced sexual activity: Not on file  Other Topics Concern  . Not on file  Social History Narrative   Lives with husband Harrison Mons     Review of Systems: Review of Systems  Constitutional: Positive for malaise/fatigue. Negative for chills and fever.  HENT: Negative for congestion, hearing loss and tinnitus.   Eyes: Negative for blurred vision and double vision.  Respiratory: Positive for shortness of breath.   Cardiovascular: Positive for leg swelling. Negative for chest pain.  Gastrointestinal: Positive for diarrhea. Negative for heartburn, nausea and vomiting.  Genitourinary: Negative for dysuria and urgency.  Musculoskeletal: Negative for joint pain and myalgias.  Skin: Negative for itching and rash.  Neurological: Positive for weakness. Negative for dizziness.  Endo/Heme/Allergies: Negative for polydipsia. Does not bruise/bleed easily.  Psychiatric/Behavioral: Positive for depression. The patient is not nervous/anxious.      Vital Signs: Blood pressure 133/78, pulse 94, temperature 98.1 F (36.7 C), temperature source Oral, resp. rate (!) 23, height 6\' 2"  (1.88 m), weight 99.7 kg, SpO2 96 %.  Weight trends: Filed Weights   02/25/2019 1416 03/02/2019 1800  Weight: 75 kg 99.7 kg    Physical Exam: General: Critically ill appearing   Head: Normocephalic, atraumatic.  Eyes: Anicteric, EOMI  Nose: Mucous membranes moist, not inflammed, nonerythematous.  Throat: Oropharynx nonerythematous, no exudate appreciated.   Neck: Supple, trachea midline.  Lungs:  Bilateral rales, normal effort  Heart: S1S2 no rubs  Abdomen:  BS normoactive. Soft, Nondistended, non-tender.  No masses or organomegaly.  Extremities: 2+ pretibial edema.  Neurologic: Lethargic but arousable  Skin: No visible rashes, scars.     Lab results: Basic Metabolic Panel: Recent Labs  Lab 03/08/2019 1429  03/02/2019 2157 02/20/19 0147 02/20/19 0605  NA 114*   < > 116* 116* 117*  K 3.7  --   --   --  3.3*  CL 79*  --   --   --  82*  CO2 20*  --   --   --  21*  GLUCOSE 125*  --   --   --  111*  BUN 16  --   --   --  14  CREATININE 0.91  --   --   --  0.72  CALCIUM 8.8*  --   --   --  8.4*  MG  --   --   --   --  1.4*  PHOS  --   --   --   --  3.3   < > = values in this interval not displayed.    Liver Function Tests: Recent Labs  Lab 02/28/2019 1429 02/20/19 0605  AST 257* 155*  ALT 189* 136*  ALKPHOS 152* 133*  BILITOT 5.0* 4.5*  PROT 7.5 6.6  ALBUMIN 3.4* 3.0*   No results for input(s): LIPASE, AMYLASE in the last 168 hours. No results for input(s): AMMONIA in the last 168 hours.  CBC: Recent Labs  Lab 02/18/2019 1429 02/20/19 0605  WBC 8.0 7.7  NEUTROABS 6.7  6.7  HGB 15.2 14.3  HCT 41.0 38.4*  MCV 88.7 88.1  PLT 155 150    Cardiac Enzymes: No results for input(s): CKTOTAL, CKMB, CKMBINDEX, TROPONINI in the last 168 hours.  BNP: Invalid input(s): POCBNP  CBG: Recent Labs  Lab 11/21/2018 1739  GLUCAP 106*    Microbiology: Results for orders placed or performed during the hospital encounter of 11/21/2018  SARS Coronavirus 2 [facility](Hospital order, Performed in [facility]) Nasopharyngeal Nasopharyngeal Swab     Status: None   Collection Time: 11/21/2018  2:33 PM   Specimen: Nasopharyngeal Swab  Result Value Ref Range Status   SARS Coronavirus 2 NEGATIVE NEGATIVE Final    Comment: (NOTE) If result is NEGATIVE SARS-CoV-2 target nucleic acids are NOT DETECTED. The SARS-CoV-2 RNA is generally detectable in upper and lower  respiratory specimens during the acute phase of infection. The lowest  concentration of SARS-CoV-2 viral copies this assay can detect is 250  copies / mL. A negative result does not preclude SARS-CoV-2 infection  and should not be used as the sole basis for  treatment or other  patient management decisions.  A negative result may occur with  improper specimen collection / handling, submission of specimen other  than nasopharyngeal swab, presence of viral mutation(s) within the  areas targeted by this assay, and inadequate number of viral copies  (<250 copies / mL). A negative result must be combined with clinical  observations, patient history, and epidemiological information. If result is POSITIVE SARS-CoV-2 target nucleic acids are DETECTED. The SARS-CoV-2 RNA is generally detectable in upper and lower  respiratory specimens dur ing the acute phase of infection.  Positive  results are indicative of active infection with SARS-CoV-2.  Clinical  correlation with patient history and other diagnostic information is  necessary to determine patient infection status.  Positive results do  not rule out bacterial infection or co-infection with other viruses. If result is PRESUMPTIVE POSTIVE SARS-CoV-2 nucleic acids MAY BE PRESENT.   A presumptive positive result was obtained on the submitted specimen  and confirmed on repeat testing.  While 2019 novel coronavirus  (SARS-CoV-2) nucleic acids may be present in the submitted sample  additional confirmatory testing may be necessary for epidemiological  and / or clinical management purposes  to differentiate between  SARS-CoV-2 and other Sarbecovirus currently known to infect humans.  If clinically indicated additional testing with an alternate test  methodology [phone]([id number]) is advised. The SARS-CoV-2 RNA is generally  detectable in upper and lower respiratory sp ecimens during the acute  phase of infection. The expected result is Negative. Fact Sheet for Patients:  BoilerBrush.com.cyhttps://www.fda.gov/media/136312/download Fact Sheet for Healthcare Providers: https://pope.com/https://www.fda.gov/media/136313/download This test is not yet approved or cleared by the [place] FDA and has been authorized for detection and/or  diagnosis of SARS-CoV-2 by FDA under an Emergency Use Authorization (EUA).  This EUA will remain in effect (meaning this test can be used) for the duration of the COVID-19 declaration under Section 564(b)(1) of the Act, 21 U.S.C. section 360bbb-3(b)(1), unless the authorization is terminated or revoked sooner. Performed at [facility], [address] [place], [place] [address]   MRSA PCR Screening     Status: None   Collection Time: 11/21/2018  6:27 PM   Specimen: Nasopharyngeal  Result Value Ref Range Status   MRSA by PCR NEGATIVE NEGATIVE Final    Comment:        The GeneXpert MRSA Assay (FDA approved for NASAL specimens only), is one component  of a comprehensive MRSA colonization surveillance program. It is not intended to diagnose MRSA infection nor to guide or monitor treatment for MRSA infections. Performed at [facility], [address] [place], [place] [address]     Coagulation Studies: Recent Labs    Mar 19, 2019 1429  LABPROT 22.5*  INR 2.0*    Urinalysis: Recent Labs    March 19, 2019 1433  COLORURINE AMBER*  LABSPEC 1.024  PHURINE 5.0  GLUCOSEU NEGATIVE  HGBUR NEGATIVE  BILIRUBINUR MODERATE*  KETONESUR 5*  PROTEINUR 100*  NITRITE NEGATIVE  LEUKOCYTESUR NEGATIVE      Imaging: Dg Chest 1 View  Result Date: 2019-03-19 CLINICAL DATA:  Dyspnea and nausea vomiting EXAM: CHEST  1 VIEW COMPARISON:  None. FINDINGS: There is mild cardiomegaly. Increased interstitial markings are seen throughout both lungs with pulmonary vascular congestion. There is trace blunting of the left costophrenic angle which could be due to trace pleural effusion. No acute osseous abnormality. IMPRESSION: Pulmonary edema and cardiomegaly. Trace left pleural effusion. Electronically Signed   By: Prudencio Pair M.D.   On: Mar 19, 2019 14:44   Ct Head Wo Contrast  Result Date: 03/19/19 CLINICAL DATA:  Progressive neuropathy. The patient is now unable to walk. EXAM: CT HEAD  WITHOUT CONTRAST TECHNIQUE: Contiguous axial images were obtained from the base of the skull through the vertex without intravenous contrast. COMPARISON:  None. FINDINGS: Brain: No evidence of acute infarction, hemorrhage, hydrocephalus, extra-axial collection or mass lesion/mass effect. There is atrophy and chronic microvascular ischemic change. Vascular: No hyperdense vessel or unexpected calcification. Skull: Intact.  No focal lesion. Sinuses/Orbits: Negative. Other: None. IMPRESSION: No acute abnormality. Atrophy and chronic microvascular ischemic change. Electronically Signed   By: Inge Rise M.D.   On: Mar 19, 2019 17:26   Dg Chest Port 1 View  Result Date: 02/20/2019 CLINICAL DATA:  Acute respiratory failure. EXAM: PORTABLE CHEST 1 VIEW COMPARISON:  Radiograph yesterday. FINDINGS: Mild cardiomegaly with no significant change from prior exam. Diffusely increased interstitial markings are again seen. Increased ill-defined infrahilar opacities since prior. Possible small left pleural effusion. No pneumothorax. IMPRESSION: 1. Cardiomegaly. Diffusely increased interstitial opacities similar to prior exam, pulmonary edema versus underlying chronic lung disease. 2. Progressive patchy opacities in the bases may be vascular or airspace disease/atelectasis. Electronically Signed   By: Keith Rake M.D.   On: 02/20/2019 03:45      Assessment & Plan: Pt is a 63 y.o. male with a PMHx of diabetes mellitus type 2, hypertension, depression, hyperlipidemia, who was admitted to [facility] on 03-19-2019 for evaluation of generalized weakness, shortness of breath, and signs of volume overload.  1.  Hyponatremia most likely due to volume overload. 2.  Pulmonary edema. 3.  Hypokalemia. 4.  Hypertension.  Plan: Patient presented initially with generalized weakness and shortness of breath and increasing lower extremity edema.  Suspect that he has underlying heart failure but 2D echocardiogram is pending.  Given  signs of volume overload now Lasix drip was initiated yesterday under our instruction.  He was started on 5 mg/h with good urine output.  We have increased this to 8 mg/h today.  In addition we will add albumin 25 g IV every 12 hours.  Continue to monitor serum sodium closely.  Cannot use ADH antagonist given liver dysfunction.  Further plan as patient progresses.

## 2019-02-20 NOTE — Progress Notes (Signed)
[facility] NAME: Matthew Washington    MR#:  [id number]  DATE OF BIRTH:  August 28, 1955  SUBJECTIVE:   Patient states he is feeling little bit better this morning.  He endorses severe lower extremity edema.  He thinks he has been peeing a lot on the Lasix drip.  No other concerns this morning.  He denies any chest pain or shortness of breath.  REVIEW OF SYSTEMS:  Review of Systems  Constitutional: Negative for chills and fever.  HENT: Negative for congestion and sore throat.   Eyes: Negative for blurred vision and double vision.  Respiratory: Positive for shortness of breath. Negative for cough.   Cardiovascular: Positive for leg swelling. Negative for chest pain and palpitations.  Gastrointestinal: Negative for nausea and vomiting.  Genitourinary: Negative for dysuria and urgency.  Musculoskeletal: Negative for back pain and neck pain.  Neurological: Negative for dizziness and headaches.  Psychiatric/Behavioral: Negative for depression. The patient is not nervous/anxious.     DRUG ALLERGIES:   Allergies  Allergen Reactions  . Shellfish Allergy Shortness Of Breath and Swelling  . Penicillins Hives  . Trazodone Other (See Comments)   VITALS:  Blood pressure 133/78, pulse 94, temperature 98.1 F (36.7 C), temperature source Oral, resp. rate (!) 23, height 6\' 2"  (1.88 m), weight 99.7 kg, SpO2 96 %. PHYSICAL EXAMINATION:  Physical Exam  GENERAL:  Laying in the bed with no acute distress.  Drowsy but easily arousable. HEENT: Head atraumatic, normocephalic. Pupils equal, round, reactive to light and accommodation. No scleral icterus. Extraocular muscles intact. Oropharynx and nasopharynx clear.  NECK:  Supple, no jugular venous distention. No thyroid enlargement. LUNGS: + Bibasilar crackles present.  Nasal cannula in place. No use of accessory muscles of respiration.  CARDIOVASCULAR: S1, S2 normal. No murmurs, rubs, or gallops.   ABDOMEN: Soft, nontender, nondistended. Bowel sounds present.  EXTREMITIES: No cyanosis, or clubbing. 3+ bilateral lower extremity pitting edema NEUROLOGIC: CN 2-12 intact, no focal deficits. 5/5 muscle strength throughout all extremities. Sensation intact throughout. Gait not checked.  PSYCHIATRIC: The patient is alert and oriented x 3.  SKIN: No obvious rash, lesion, or ulcer.  LABORATORY PANEL:  Male CBC Recent Labs  Lab 02/20/19 0605  WBC 7.7  HGB 14.3  HCT 38.4*  PLT 150   ------------------------------------------------------------------------------------------------------------------ Chemistries  Recent Labs  Lab 02/20/19 0605 02/20/19 1112  NA 117* 118*  K 3.3*  --   CL 82*  --   CO2 21*  --   GLUCOSE 111*  --   BUN 14  --   CREATININE 0.72  --   CALCIUM 8.4*  --   MG 1.4*  --   AST 155*  --   ALT 136*  --   ALKPHOS 133*  --   BILITOT 4.5*  --    RADIOLOGY:  Dg Chest 1 View  Result Date: 03/14/2019 CLINICAL DATA:  Dyspnea and nausea vomiting EXAM: CHEST  1 VIEW COMPARISON:  None. FINDINGS: There is mild cardiomegaly. Increased interstitial markings are seen throughout both lungs with pulmonary vascular congestion. There is trace blunting of the left costophrenic angle which could be due to trace pleural effusion. No acute osseous abnormality. IMPRESSION: Pulmonary edema and cardiomegaly. Trace left pleural effusion. Electronically Signed   By: Prudencio Pair M.D.   On: 03/02/2019 14:44   Ct Head Wo Contrast  Result Date: 02/25/2019 CLINICAL DATA:  Progressive neuropathy. The patient is now unable to walk. EXAM: CT  HEAD WITHOUT CONTRAST TECHNIQUE: Contiguous axial images were obtained from the base of the skull through the vertex without intravenous contrast. COMPARISON:  None. FINDINGS: Brain: No evidence of acute infarction, hemorrhage, hydrocephalus, extra-axial collection or mass lesion/mass effect. There is atrophy and chronic microvascular ischemic change.  Vascular: No hyperdense vessel or unexpected calcification. Skull: Intact.  No focal lesion. Sinuses/Orbits: Negative. Other: None. IMPRESSION: No acute abnormality. Atrophy and chronic microvascular ischemic change. Electronically Signed   By: Drusilla Kannerhomas  Dalessio M.D.   On: 04/19/2019 17:26   Dg Chest Port 1 View  Result Date: 02/20/2019 CLINICAL DATA:  Acute respiratory failure. EXAM: PORTABLE CHEST 1 VIEW COMPARISON:  Radiograph yesterday. FINDINGS: Mild cardiomegaly with no significant change from prior exam. Diffusely increased interstitial markings are again seen. Increased ill-defined infrahilar opacities since prior. Possible small left pleural effusion. No pneumothorax. IMPRESSION: 1. Cardiomegaly. Diffusely increased interstitial opacities similar to prior exam, pulmonary edema versus underlying chronic lung disease. 2. Progressive patchy opacities in the bases may be vascular or airspace disease/atelectasis. Electronically Signed   By: Narda RutherfordMelanie  Sanford M.D.   On: 02/20/2019 03:45   ASSESSMENT AND PLAN:   Acute hypoxic respiratory failure secondary to acute systolic heart failure- patient appears markedly volume overloaded. Currently on 2L O2. -Continue Lasix drip and albumin 25 g IV every 12 hours -Echo pending -Nephrology consulted  Hypervolemic hyponatremia- sodium slowly improving -Continue treatment as above  Hypokalemia/hypomagnesemia- likely due to being on Lasix drip -Replete and recheck  Diarrhea- improving -Monitor -Can obtain C. Diff and GI pathogen panel if needed  Type 2 diabetes -SSI  Hypertension- BP well controlled -Holding home losartan  Hyperlipidemia-stable -Continue home simvastatin  DVT prophylaxis-Lovenox   All the records are reviewed and case discussed with Care Management/Social Worker. Management plans discussed with the patient, family and they are in agreement.  CODE STATUS: Full Code  TOTAL TIME TAKING CARE OF THIS PATIENT: 40 minutes.    More than 50% of the time was spent in counseling/coordination of care: YES  POSSIBLE D/C IN 3-5 DAYS, DEPENDING ON CLINICAL CONDITION.   Jinny BlossomKaty D Mayo M.D on 02/20/2019 at 12:13 PM  Between 7am to 6pm - Pager - [phone]  After 6pm go to [email or web address] - Social research officer, governmentpassword EPAS [facility]  [facility]  Office  [phone]  CC: Primary care physician; Patient, No Pcp Per  Note: This dictation was prepared with Dragon dictation along with smaller phrase technology. Any transcriptional errors that result from this process are unintentional.

## 2019-02-20 NOTE — TOC Initial Note (Signed)
Transition of Care [facility]) - Initial/Assessment Note    Patient Details  Name: Matthew Washington. MRN: [id number] Date of Birth: 06-09-1956  [facility] CM/SW Contact:    Candie Chroman, LCSW Phone Number: 02/20/2019, 3:21 PM  Clinical Narrative: CSW met with patient and introduced role. Patient confirmed he has no insurance or PCP. He said the last time he had an appt with a PCP was when he lived in [place] 30 years ago. He also said he went to the [facility] about 4 years ago. CSW provided booklet for free/low-cost healthcare in [facility] with intake paperwork for [facility]. Patient unsure if he has issues paying for medications. He states he has no income. Patient agreeable to substance use treatment resources. CSW provided list of local options. No further concerns. CSW encouraged patient to contact CSW as needed. CSW will continue to follow patient for support and facilitate return home when stable.                 Expected Discharge Plan: Home/Self Care Barriers to Discharge: Continued Medical Work up   Patient Goals and CMS Choice        Expected Discharge Plan and Services Expected Discharge Plan: Home/Self Care       Living arrangements for the past 2 months: Apartment                                      Prior Living Arrangements/Services Living arrangements for the past 2 months: Apartment Lives with:: Roommate Patient language and need for interpreter reviewed:: Yes Do you feel safe going back to the place where you live?: Yes      Need for Family Participation in Patient Care: Yes (Comment) Care giver support system in place?: Yes (comment)   Criminal Activity/Legal Involvement Pertinent to Current Situation/Hospitalization: No - Comment as needed  Activities of Daily Living Home Assistive Devices/Equipment: Wheelchair ADL Screening (condition at time of admission) Patient's cognitive ability adequate to safely complete  daily activities?: Yes Is the patient deaf or have difficulty hearing?: No Does the patient have difficulty seeing, even when wearing glasses/contacts?: No Does the patient have difficulty concentrating, remembering, or making decisions?: No Patient able to express need for assistance with ADLs?: Yes Does the patient have difficulty dressing or bathing?: No Independently performs ADLs?: Yes (appropriate for developmental age) Does the patient have difficulty walking or climbing stairs?: Yes Weakness of Legs: Both Weakness of Arms/Hands: Both  Permission Sought/Granted                  Emotional Assessment Appearance:: Appears stated age Attitude/Demeanor/Rapport: Engaged, Gracious Affect (typically observed): Accepting, Appropriate, Calm, Pleasant Orientation: : Oriented to Self, Oriented to Place, Oriented to  Time, Oriented to Situation Alcohol / Substance Use: Alcohol Use Psych Involvement: No (comment)  Admission diagnosis:  Hyponatremia [E87.1] Acute pulmonary edema (HCC) [J81.0] Weakness [R53.1] Pain [R52] Hypoxia [R09.02] Bilateral lower extremity edema [R60.0] Pain in both lower extremities [K93.818, M79.605] Patient Active Problem List   Diagnosis Date Noted  . Acute pulmonary edema (HCC)   . Acute hyponatremia 03/13/2019  . Peripheral neuropathy 03/26/2014  . Diabetes ([place]) 03/04/2014  . Hypertension 03/04/2014  . Hyperlipidemia 03/04/2014  . Depression 03/04/2014   PCP:  Patient, No Pcp Per Pharmacy:  No Pharmacies Listed    Social Determinants of Health (SDOH) Interventions    Readmission  Risk Interventions No flowsheet data found.

## 2019-02-20 NOTE — Progress Notes (Signed)
*  PRELIMINARY RESULTS* Echocardiogram 2D Echocardiogram has been performed.  Matthew Washington 02/20/2019, 12:36 PM

## 2019-02-20 NOTE — Progress Notes (Signed)
PHARMACY CONSULT NOTE - FOLLOW UP  Pharmacy Consult for Electrolyte Monitoring and Replacement   Recent Labs: Potassium (mmol/L)  Date Value  02/20/2019 3.3 (L)  04/16/2014 4.4   Magnesium (mg/dL)  Date Value  02/20/2019 1.4 (L)   Calcium (mg/dL)  Date Value  02/20/2019 8.4 (L)   Calcium, Total (mg/dL)  Date Value  04/16/2014 8.3 (L)   Albumin (g/dL)  Date Value  02/20/2019 3.0 (L)  01/16/2014 4.0   Phosphorus (mg/dL)  Date Value  02/20/2019 3.3   Sodium (mmol/L)  Date Value  02/20/2019 117 (LL)  11/11/2014 139  04/16/2014 137     Assessment: 63 year old male with h/o significant alcohol use presented with hyponatremia most likely due to volume overload. Suspect heart failure may be contributing. 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  Goal of Therapy:  Electrolytes WNL  Plan:  Na 117. Furosemide drip increased from 5 to 8 mg/hr. Currently holding on starting hypertonic saline. Continues to have good urine output, renal function stable.  Mg 1.4 - 4 g IV x 1  K 3.3 - received 40 mEq PO x 1. Order for an additional 10 mEq IV x 3 runs. With increase in furosemide drip, will order additional PO replacement for this evening.   Tawnya Crook ,PharmD Clinical Pharmacist 02/20/2019 11:48 AM

## 2019-02-20 NOTE — Progress Notes (Signed)
CRITICAL CARE NOTE  CC  Severe hyponatremia  SUBJECTIVE Patient remains critically ill Prognosis is guarded On lasix infusion Na now 117 Severe fluid overload     BP 133/78   Pulse 94   Temp 98.2 F (36.8 C) (Oral)   Resp (!) 23   Ht 6\' 2"  (1.88 m)   Wt 99.7 kg   SpO2 96%   BMI 28.22 kg/m    I/O last 3 completed shifts: In: 430.7 [I.V.:59.5; IV Piggyback:371.3] Out: 1275 [Urine:1275] No intake/output data recorded.  SpO2: 96 % O2 Flow Rate (L/min): 4 L/min   SIGNIFICANT EVENTS 9/2 lasix infusion started  REVIEW OF SYSTEMS  PATIENT IS UNABLE TO PROVIDE COMPLETE REVIEW OF SYSTEMS DUE TO SEVERE CRITICAL ILLNESS   PHYSICAL EXAMINATION:  GENERAL:critically ill appearing,  HEAD: Normocephalic, atraumatic.  EYES: Pupils equal, round, reactive to light.  No scleral icterus.  MOUTH: Moist mucosal membrane. NECK: Supple.  PULMONARY: +rhonchi, +wheezing CARDIOVASCULAR: S1 and S2. Regular rate and rhythm. No murmurs, rubs, or gallops.  GASTROINTESTINAL: Soft, nontender, -distended. No masses. Positive bowel sounds. No hepatosplenomegaly.  MUSCULOSKELETAL: +edema.  NEUROLOGIC: al;ert and awake SKIN:intact,warm,dry  MEDICATIONS: I have reviewed all medications and confirmed regimen as documented   CULTURE RESULTS   Recent Results (from the past 240 hour(s))  SARS Coronavirus 2 [facility](Hospital order, [facility]) Nasopharyngeal Nasopharyngeal Swab     Status: None   Collection Time: 03/19/2019  2:33 PM   Specimen: Nasopharyngeal Swab  Result Value Ref Range Status   SARS Coronavirus 2 NEGATIVE NEGATIVE Final    Comment: (NOTE) If result is NEGATIVE SARS-CoV-2 target nucleic acids are NOT DETECTED. The SARS-CoV-2 RNA is generally detectable in upper and lower  respiratory specimens during the acute phase of infection. The lowest  concentration of SARS-CoV-2 viral copies this assay can detect is 250  copies / mL. A negative result does not  preclude SARS-CoV-2 infection  and should not be used as the sole basis for treatment or other  patient management decisions.  A negative result may occur with  improper specimen collection / handling, submission of specimen other  than nasopharyngeal swab, presence of viral mutation(s) within the  areas targeted by this assay, and inadequate number of viral copies  (<250 copies / mL). A negative result must be combined with clinical  observations, patient history, and epidemiological information. If result is POSITIVE SARS-CoV-2 target nucleic acids are DETECTED. The SARS-CoV-2 RNA is generally detectable in upper and lower  respiratory specimens dur ing the acute phase of infection.  Positive  results are indicative of active infection with SARS-CoV-2.  Clinical  correlation with patient history and other diagnostic information is  necessary to determine patient infection status.  Positive results do  not rule out bacterial infection or co-infection with other viruses. If result is PRESUMPTIVE POSTIVE SARS-CoV-2 nucleic acids MAY BE PRESENT.   A presumptive positive result was obtained on the submitted specimen  and confirmed on repeat testing.  While 2019 novel coronavirus  (SARS-CoV-2) nucleic acids may be present in the submitted sample  additional confirmatory testing may be necessary for epidemiological  and / or clinical management purposes  to differentiate between  SARS-CoV-2 and other Sarbecovirus currently known to infect humans.  If clinically indicated additional testing with an alternate test  methodology [phone]([id number] is advised. The SARS-CoV-2 RNA is generally  detectable in upper and lower respiratory sp ecimens during the acute  phase of infection. The expected result is Negative. Fact  Sheet for Patients:  BoilerBrush.com.cy Fact Sheet for Healthcare Providers: https://pope.com/ This test is not yet approved or cleared by  the [place] FDA and has been authorized for detection and/or diagnosis of SARS-CoV-2 by FDA under an Emergency Use Authorization (EUA).  This EUA will remain in effect (meaning this test can be used) for the duration of the COVID-19 declaration under Section 564(b)(1) of the Act, 21 U.S.C. section 360bbb-3(b)(1), unless the authorization is terminated or revoked sooner. Performed at [facility], [address] [place], [place] [address]   MRSA PCR Screening     Status: None   Collection Time: 03/05/2019  6:27 PM   Specimen: Nasopharyngeal  Result Value Ref Range Status   MRSA by PCR NEGATIVE NEGATIVE Final    Comment:        The GeneXpert MRSA Assay (FDA approved for NASAL specimens only), is one component of a comprehensive MRSA colonization surveillance program. It is not intended to diagnose MRSA infection nor to guide or monitor treatment for MRSA infections. Performed at [facility], [address] [place], [place] [address]           IMAGING    Dg Chest 1 View  Result Date: 03/09/2019 CLINICAL DATA:  Dyspnea and nausea vomiting EXAM: CHEST  1 VIEW COMPARISON:  None. FINDINGS: There is mild cardiomegaly. Increased interstitial markings are seen throughout both lungs with pulmonary vascular congestion. There is trace blunting of the left costophrenic angle which could be due to trace pleural effusion. No acute osseous abnormality. IMPRESSION: Pulmonary edema and cardiomegaly. Trace left pleural effusion. Electronically Signed   By: Jonna Clark M.D.   On: 03/16/2019 14:44   Ct Head Wo Contrast  Result Date: 03/17/2019 CLINICAL DATA:  Progressive neuropathy. The patient is now unable to walk. EXAM: CT HEAD WITHOUT CONTRAST TECHNIQUE: Contiguous axial images were obtained from the base of the skull through the vertex without intravenous contrast. COMPARISON:  None. FINDINGS: Brain: No evidence of acute infarction, hemorrhage, hydrocephalus,  extra-axial collection or mass lesion/mass effect. There is atrophy and chronic microvascular ischemic change. Vascular: No hyperdense vessel or unexpected calcification. Skull: Intact.  No focal lesion. Sinuses/Orbits: Negative. Other: None. IMPRESSION: No acute abnormality. Atrophy and chronic microvascular ischemic change. Electronically Signed   By: Drusilla Kanner M.D.   On: 03/17/2019 17:26   Dg Chest Port 1 View  Result Date: 02/20/2019 CLINICAL DATA:  Acute respiratory failure. EXAM: PORTABLE CHEST 1 VIEW COMPARISON:  Radiograph yesterday. FINDINGS: Mild cardiomegaly with no significant change from prior exam. Diffusely increased interstitial markings are again seen. Increased ill-defined infrahilar opacities since prior. Possible small left pleural effusion. No pneumothorax. IMPRESSION: 1. Cardiomegaly. Diffusely increased interstitial opacities similar to prior exam, pulmonary edema versus underlying chronic lung disease. 2. Progressive patchy opacities in the bases may be vascular or airspace disease/atelectasis. Electronically Signed   By: Narda Rutherford M.D.   On: 02/20/2019 03:45   CBC    Component Value Date/Time   WBC 7.7 02/20/2019 0605   RBC 4.36 02/20/2019 0605   HGB 14.3 02/20/2019 0605   HGB 12.3 (L) 04/16/2014 0558   HCT 38.4 (L) 02/20/2019 0605   HCT 48.0 01/16/2014 2007   PLT 150 02/20/2019 0605   PLT 302 04/16/2014 0558   MCV 88.1 02/20/2019 0605   MCV 98 01/16/2014 2007   MCH 32.8 02/20/2019 0605   MCHC 37.2 (H) 02/20/2019 0605   RDW 14.0 02/20/2019 0605   RDW 13.9 01/16/2014 2007  LYMPHSABS 0.5 (L) 02/20/2019 0605   MONOABS 0.5 02/20/2019 0605   EOSABS 0.0 02/20/2019 0605   BASOSABS 0.0 02/20/2019 0605   BMP Latest Ref Rng & Units 02/20/2019 02/20/2019 03/14/2019  Glucose 70 - 99 mg/dL 111(H) - -  BUN 8 - 23 mg/dL 14 - -  Creatinine 0.61 - 1.24 mg/dL 0.72 - -  Sodium 135 - 145 mmol/L 117(LL) 116(LL) 116(LL)  Potassium 3.5 - 5.1 mmol/L 3.3(L) - -  Chloride 98  - 111 mmol/L 82(L) - -  CO2 22 - 32 mmol/L 21(L) - -  Calcium 8.9 - 10.3 mg/dL 8.4(L) - -         ASSESSMENT AND PLAN SYNOPSIS   ACUTE SYSTOLIC CARDIAC FAILURE- ECHO pending -oxygen as needed -Lasix infusion -follow up cardiac enzymes as indicated  SEVERE hyponatremia from CHF Lasix infusions Follow up nephrology recs  CARDIAC ICU monitoring   GI GI PROPHYLAXIS as indicated  NUTRITIONAL STATUS DIET-->as tolerated Constipation protocol as indicated  ENDO - will use ICU hypoglycemic\Hyperglycemia protocol if indicated   ELECTROLYTES -follow labs as needed -replace as needed -pharmacy consultation and following   DVT/GI PRX ordered TRANSFUSIONS AS NEEDED MONITOR FSBS ASSESS the need for LABS as needed   Critical Care Time devoted to patient care services described in this note is 31 minutes.   Overall, patient is critically ill, prognosis is guarded.    Corrin Parker, M.D.  [facility]  Medical Director [place] Director [facility] Cardio-Pulmonary Department

## 2019-02-20 NOTE — Progress Notes (Signed)
CRITICAL VALUE ALERT  Critical Value:  Na+ 118  Date & Time Notied:  9/3 @ 2300  Provider Notified: J.Keene, NP  Orders Received/Actions taken: PCCM aware of hyponatremia, no new orders at this time

## 2019-02-21 LAB — POTASSIUM: Potassium: 3.7 mmol/L (ref 3.5–5.1)

## 2019-02-21 LAB — CBC WITH DIFFERENTIAL/PLATELET
Abs Immature Granulocytes: 0.02 10*3/uL (ref 0.00–0.07)
Basophils Absolute: 0 10*3/uL (ref 0.0–0.1)
Basophils Relative: 0 %
Eosinophils Absolute: 0 10*3/uL (ref 0.0–0.5)
Eosinophils Relative: 0 %
HCT: 36.5 % — ABNORMAL LOW (ref 39.0–52.0)
Hemoglobin: 13.4 g/dL (ref 13.0–17.0)
Immature Granulocytes: 0 %
Lymphocytes Relative: 10 %
Lymphs Abs: 0.6 10*3/uL — ABNORMAL LOW (ref 0.7–4.0)
MCH: 32.4 pg (ref 26.0–34.0)
MCHC: 36.7 g/dL — ABNORMAL HIGH (ref 30.0–36.0)
MCV: 88.4 fL (ref 80.0–100.0)
Monocytes Absolute: 0.4 10*3/uL (ref 0.1–1.0)
Monocytes Relative: 6 %
Neutro Abs: 5.5 10*3/uL (ref 1.7–7.7)
Neutrophils Relative %: 84 %
Platelets: 138 10*3/uL — ABNORMAL LOW (ref 150–400)
RBC: 4.13 MIL/uL — ABNORMAL LOW (ref 4.22–5.81)
RDW: 14.2 % (ref 11.5–15.5)
WBC: 6.5 10*3/uL (ref 4.0–10.5)
nRBC: 0 % (ref 0.0–0.2)

## 2019-02-21 LAB — COMPREHENSIVE METABOLIC PANEL
ALT: 107 U/L — ABNORMAL HIGH (ref 0–44)
AST: 117 U/L — ABNORMAL HIGH (ref 15–41)
Albumin: 3.6 g/dL (ref 3.5–5.0)
Alkaline Phosphatase: 164 U/L — ABNORMAL HIGH (ref 38–126)
Anion gap: 14 (ref 5–15)
BUN: 11 mg/dL (ref 8–23)
CO2: 24 mmol/L (ref 22–32)
Calcium: 8.3 mg/dL — ABNORMAL LOW (ref 8.9–10.3)
Chloride: 81 mmol/L — ABNORMAL LOW (ref 98–111)
Creatinine, Ser: 0.69 mg/dL (ref 0.61–1.24)
GFR calc Af Amer: >60 mL/min (ref >60)
GFR calc non Af Amer: >60 mL/min (ref >60)
Glucose, Bld: 122 mg/dL — ABNORMAL HIGH (ref 70–99)
Potassium: 3.2 mmol/L — ABNORMAL LOW (ref 3.5–5.1)
Sodium: 119 mmol/L — CL (ref 135–145)
Total Bilirubin: 5.1 mg/dL — ABNORMAL HIGH (ref 0.3–1.2)
Total Protein: 6.8 g/dL (ref 6.5–8.1)

## 2019-02-21 LAB — GLUCOSE, CAPILLARY
Glucose-Capillary: 194 mg/dL — ABNORMAL HIGH (ref 70–99)
Glucose-Capillary: 190 mg/dL — ABNORMAL HIGH (ref 70–99)

## 2019-02-21 LAB — SODIUM
Sodium: 120 mmol/L — ABNORMAL LOW (ref 135–145)
Sodium: 119 mmol/L — CL (ref 135–145)

## 2019-02-21 LAB — MAGNESIUM
Magnesium: 1.5 mg/dL — ABNORMAL LOW (ref 1.7–2.4)
Magnesium: 2 mg/dL (ref 1.7–2.4)

## 2019-02-21 LAB — PHOSPHORUS: Phosphorus: 3.3 mg/dL (ref 2.5–4.6)

## 2019-02-21 LAB — HIV ANTIBODY (ROUTINE TESTING W REFLEX): HIV Screen 4th Generation wRfx: NONREACTIVE

## 2019-02-21 MED ORDER — THIAMINE HCL 100 MG/ML IJ SOLN: 250.0000 mg | Freq: Every day | INTRAVENOUS | Status: DC

## 2019-02-21 MED ORDER — POTASSIUM CHLORIDE CRYS ER 20 MEQ PO TBCR
40.0000 meq | EXTENDED_RELEASE_TABLET | ORAL | Status: AC
  Administered 2019-02-21 (×2): 40 meq via ORAL
  Filled 2019-02-21 (×2): qty 2

## 2019-02-21 MED ORDER — INSULIN ASPART 100 UNIT/ML ~~LOC~~ SOLN
0.0000 [IU] | Freq: Three times a day (TID) | SUBCUTANEOUS | Status: DC
  Administered 2019-02-21: 2 [IU] via SUBCUTANEOUS
  Administered 2019-02-22 (×2): 1 [IU] via SUBCUTANEOUS
  Administered 2019-02-22: 3 [IU] via SUBCUTANEOUS
  Administered 2019-02-23: 2 [IU] via SUBCUTANEOUS
  Administered 2019-02-24: 17:00:00 1 [IU] via SUBCUTANEOUS
  Administered 2019-02-25: 08:00:00 3 [IU] via SUBCUTANEOUS
  Administered 2019-02-25: 12:00:00 5 [IU] via SUBCUTANEOUS
  Administered 2019-02-25: 9 [IU] via SUBCUTANEOUS
  Administered 2019-02-26: 3 [IU] via SUBCUTANEOUS
  Filled 2019-02-21 (×12): qty 1

## 2019-02-21 MED ORDER — POTASSIUM CHLORIDE CRYS ER 20 MEQ PO TBCR
40.0000 meq | EXTENDED_RELEASE_TABLET | Freq: Two times a day (BID) | ORAL | Status: AC
  Administered 2019-02-21 (×2): 40 meq via ORAL
  Filled 2019-02-21 (×2): qty 2

## 2019-02-21 MED ORDER — ENSURE ENLIVE PO LIQD: 237.0000 mL | Freq: Two times a day (BID) | ORAL | Status: DC

## 2019-02-21 MED ORDER — THIAMINE HCL 100 MG/ML IJ SOLN
500.0000 mg | Freq: Three times a day (TID) | INTRAVENOUS | Status: AC
  Administered 2019-02-21 – 2019-02-23 (×6): 500 mg via INTRAVENOUS
  Filled 2019-02-21 (×6): qty 5

## 2019-02-21 MED ORDER — BOOST / RESOURCE BREEZE PO LIQD CUSTOM
1.0000 | Freq: Three times a day (TID) | ORAL | Status: DC
  Administered 2019-02-21 – 2019-02-26 (×11): 1 via ORAL

## 2019-02-21 MED ORDER — THIAMINE HCL 100 MG/ML IJ SOLN
250.0000 mg | Freq: Every day | INTRAVENOUS | Status: DC
  Administered 2019-02-24: 13:00:00 250 mg via INTRAVENOUS
  Filled 2019-02-21 (×3): qty 2.5

## 2019-02-21 MED ORDER — POTASSIUM CHLORIDE 10 MEQ/100ML IV SOLN
10.0000 meq | INTRAVENOUS | Status: AC
  Administered 2019-02-21 (×4): 10 meq via INTRAVENOUS
  Filled 2019-02-21 (×4): qty 100

## 2019-02-21 MED ORDER — MAGNESIUM SULFATE IN D5W 1-5 GM/100ML-% IV SOLN
1.0000 g | Freq: Once | INTRAVENOUS | Status: AC
  Administered 2019-02-21: 06:00:00 1 g via INTRAVENOUS
  Filled 2019-02-21: qty 100

## 2019-02-21 MED ORDER — INSULIN ASPART 100 UNIT/ML ~~LOC~~ SOLN: 0.0000 [IU] | Freq: Three times a day (TID) | SUBCUTANEOUS | Status: DC

## 2019-02-21 MED ORDER — INSULIN ASPART 100 UNIT/ML ~~LOC~~ SOLN
0.0000 [IU] | Freq: Every day | SUBCUTANEOUS | Status: DC
  Administered 2019-02-22 – 2019-02-25 (×3): 2 [IU] via SUBCUTANEOUS
  Filled 2019-02-21 (×3): qty 1

## 2019-02-21 MED ORDER — MAGNESIUM SULFATE 2 GM/50ML IV SOLN
2.0000 g | Freq: Once | INTRAVENOUS | Status: AC
  Administered 2019-02-21: 2 g via INTRAVENOUS
  Filled 2019-02-21: qty 50

## 2019-02-21 MED ORDER — THIAMINE HCL 100 MG/ML IJ SOLN
500.0000 mg | Freq: Every day | INTRAVENOUS | Status: DC
  Filled 2019-02-21: qty 5

## 2019-02-21 NOTE — Progress Notes (Signed)
[facility]  ROUNDING NOTE   Subjective:  Patient seen at bedside. Did have diuretic response, 5.2 L of urine output yesterday. Low potassium noted. Patient feeling a bit better.   Objective:  Vital signs in last 24 hours:  Temp:  [97.1 F (36.2 C)-98.2 F (36.8 C)] 97.1 F (36.2 C) (09/04 0753) Pulse Rate:  [89-102] 94 (09/04 0800) Resp:  [20-36] 27 (09/04 0800) BP: (122-146)/(78-105) 136/80 (09/04 0800) SpO2:  [85 %-100 %] 91 % (09/04 0800)  Weight change:  Filed Weights   September 06, 2018 1416 September 06, 2018 1800  Weight: 75 kg 99.7 kg    Intake/Output: I/O last 3 completed shifts: In: 1683.2 [P.O.:820; I.V.:246.9; IV Piggyback:616.3] Out: 6700 [Urine:6700]   Intake/Output this shift:  Total I/O In: 120 [P.O.:120] Out: 300 [Urine:300]  Physical Exam: General: No acute distress  Head: Normocephalic, atraumatic. Moist oral mucosal membranes  Eyes: Anicteric  Neck: Supple, trachea midline  Lungs:   Basilar rales, normal effort  Heart: S1S2 no rubs  Abdomen:  Soft, nontender, bowel sounds present  Extremities: 2+ peripheral edema.  Neurologic: Awake, alert, following commands  Skin: No lesions       Basic Metabolic Panel: Recent Labs  Lab September 06, 2018 1429  02/20/19 0605  02/20/19 1509 02/20/19 1813 02/20/19 2233 02/21/19 0329 02/21/19 0632  NA 114*   < > 117*   < > 117* 118* 118* 119* 120*  K 3.7  --  3.3*  --   --  3.7  --  3.2*  --   CL 79*  --  82*  --   --   --   --  81*  --   CO2 20*  --  21*  --   --   --   --  24  --   GLUCOSE 125*  --  111*  --   --   --   --  122*  --   BUN 16  --  14  --   --   --   --  11  --   CREATININE 0.91  --  0.72  --   --   --   --  0.69  --   CALCIUM 8.8*  --  8.4*  --   --   --   --  8.3*  --   MG  --   --  1.4*  --   --   --   --  1.5*  --   PHOS  --   --  3.3  --   --   --   --  3.3  --    < > = values in this interval not displayed.    Liver Function Tests: Recent Labs  Lab September 06, 2018 1429 02/20/19 0605  02/21/19 0329  AST 257* 155* 117*  ALT 189* 136* 107*  ALKPHOS 152* 133* 164*  BILITOT 5.0* 4.5* 5.1*  PROT 7.5 6.6 6.8  ALBUMIN 3.4* 3.0* 3.6   No results for input(s): LIPASE, AMYLASE in the last 168 hours. No results for input(s): AMMONIA in the last 168 hours.  CBC: Recent Labs  Lab September 06, 2018 1429 02/20/19 0605 02/21/19 0236  WBC 8.0 7.7 6.5  NEUTROABS 6.7 6.7 5.5  HGB 15.2 14.3 13.4  HCT 41.0 38.4* 36.5*  MCV 88.7 88.1 88.4  PLT 155 150 138*    Cardiac Enzymes: No results for input(s): CKTOTAL, CKMB, CKMBINDEX, TROPONINI in the last 168 hours.  BNP: Invalid input(s): POCBNP  CBG: Recent Labs  Lab Mar 11, 2019 1739  GLUCAP 106*    Microbiology: Results for orders placed or performed during the hospital encounter of 03-11-19  SARS Coronavirus 2 [facility] order, Performed in [facility] hospital lab) Nasopharyngeal Nasopharyngeal Swab     Status: None   Collection Time: 03/11/2019  2:33 PM   Specimen: Nasopharyngeal Swab  Result Value Ref Range Status   SARS Coronavirus 2 NEGATIVE NEGATIVE Final    Comment: (NOTE) If result is NEGATIVE SARS-CoV-2 target nucleic acids are NOT DETECTED. The SARS-CoV-2 RNA is generally detectable in upper and lower  respiratory specimens during the acute phase of infection. The lowest  concentration of SARS-CoV-2 viral copies this assay can detect is 250  copies / mL. A negative result does not preclude SARS-CoV-2 infection  and should not be used as the sole basis for treatment or other  patient management decisions.  A negative result may occur with  improper specimen collection / handling, submission of specimen other  than nasopharyngeal swab, presence of viral mutation(s) within the  areas targeted by this assay, and inadequate number of viral copies  (<250 copies / mL). A negative result must be combined with clinical  observations, patient history, and epidemiological information. If result is POSITIVE SARS-CoV-2 target  nucleic acids are DETECTED. The SARS-CoV-2 RNA is generally detectable in upper and lower  respiratory specimens dur ing the acute phase of infection.  Positive  results are indicative of active infection with SARS-CoV-2.  Clinical  correlation with patient history and other diagnostic information is  necessary to determine patient infection status.  Positive results do  not rule out bacterial infection or co-infection with other viruses. If result is PRESUMPTIVE POSTIVE SARS-CoV-2 nucleic acids MAY BE PRESENT.   A presumptive positive result was obtained on the submitted specimen  and confirmed on repeat testing.  While 2019 novel coronavirus  (SARS-CoV-2) nucleic acids may be present in the submitted sample  additional confirmatory testing may be necessary for epidemiological  and / or clinical management purposes  to differentiate between  SARS-CoV-2 and other Sarbecovirus currently known to infect humans.  If clinically indicated additional testing with an alternate test  methodology [phone]) is advised. The SARS-CoV-2 RNA is generally  detectable in upper and lower respiratory sp ecimens during the acute  phase of infection. The expected result is Negative. Fact Sheet for Patients:  StrictlyIdeas.no Fact Sheet for Healthcare Providers: BankingDealers.co.za This test is not yet approved or cleared by the [place] FDA and has been authorized for detection and/or diagnosis of SARS-CoV-2 by FDA under an Emergency Use Authorization (EUA).  This EUA will remain in effect (meaning this test can be used) for the duration of the COVID-19 declaration under Section 564(b)(1) of the Act, 21 U.S.C. section 360bbb-3(b)(1), unless the authorization is terminated or revoked sooner. Performed at [facility], [address] [place], [place] [address]   MRSA PCR Screening     Status: None   Collection Time: 2019-03-11  6:27 PM    Specimen: Nasopharyngeal  Result Value Ref Range Status   MRSA by PCR NEGATIVE NEGATIVE Final    Comment:        The GeneXpert MRSA Assay (FDA approved for NASAL specimens only), is one component of a comprehensive MRSA colonization surveillance program. It is not intended to diagnose MRSA infection nor to guide or monitor treatment for MRSA infections. Performed at [facility], [address] [place], [place] [address]     Coagulation Studies: Recent Labs  01-Nov-2018 1429  LABPROT 22.5*  INR 2.0*    Urinalysis: Recent Labs    01-Nov-2018 1433  COLORURINE AMBER*  LABSPEC 1.024  PHURINE 5.0  GLUCOSEU NEGATIVE  HGBUR NEGATIVE  BILIRUBINUR MODERATE*  KETONESUR 5*  PROTEINUR 100*  NITRITE NEGATIVE  LEUKOCYTESUR NEGATIVE      Imaging: Dg Chest 1 View  Result Date: 03/07/2019 CLINICAL DATA:  Dyspnea and nausea vomiting EXAM: CHEST  1 VIEW COMPARISON:  None. FINDINGS: There is mild cardiomegaly. Increased interstitial markings are seen throughout both lungs with pulmonary vascular congestion. There is trace blunting of the left costophrenic angle which could be due to trace pleural effusion. No acute osseous abnormality. IMPRESSION: Pulmonary edema and cardiomegaly. Trace left pleural effusion. Electronically Signed   By: Jonna ClarkBindu  Avutu M.D.   On: 15-May-202020 14:44   Ct Head Wo Contrast  Result Date: 03/03/2019 CLINICAL DATA:  Progressive neuropathy. The patient is now unable to walk. EXAM: CT HEAD WITHOUT CONTRAST TECHNIQUE: Contiguous axial images were obtained from the base of the skull through the vertex without intravenous contrast. COMPARISON:  None. FINDINGS: Brain: No evidence of acute infarction, hemorrhage, hydrocephalus, extra-axial collection or mass lesion/mass effect. There is atrophy and chronic microvascular ischemic change. Vascular: No hyperdense vessel or unexpected calcification. Skull: Intact.  No focal lesion. Sinuses/Orbits: Negative. Other: None.  IMPRESSION: No acute abnormality. Atrophy and chronic microvascular ischemic change. Electronically Signed   By: Drusilla Kannerhomas  Dalessio M.D.   On: 15-May-202020 17:26   [place] Venous Img Lower Bilateral  Result Date: 02/20/2019 CLINICAL DATA:  Bilateral lower extremity pain and edema. History of smoking. Evaluate for DVT. EXAM: BILATERAL LOWER EXTREMITY VENOUS DOPPLER ULTRASOUND TECHNIQUE: Gray-scale sonography with graded compression, as well as color Doppler and duplex ultrasound were performed to evaluate the lower extremity deep venous systems from the level of the common femoral vein and including the common femoral, femoral, profunda femoral, popliteal and calf veins including the posterior tibial, peroneal and gastrocnemius veins when visible. The superficial great saphenous vein was also interrogated. Spectral Doppler was utilized to evaluate flow at rest and with distal augmentation maneuvers in the common femoral, femoral and popliteal veins. COMPARISON:  None. FINDINGS: RIGHT LOWER EXTREMITY Common Femoral Vein: No evidence of thrombus. Normal compressibility, respiratory phasicity and response to augmentation. Saphenofemoral Junction: No evidence of thrombus. Normal compressibility and flow on color Doppler imaging. Profunda Femoral Vein: No evidence of thrombus. Normal compressibility and flow on color Doppler imaging. Femoral Vein: No evidence of thrombus. Normal compressibility, respiratory phasicity and response to augmentation. Popliteal Vein: No evidence of thrombus. Normal compressibility, respiratory phasicity and response to augmentation. Calf Veins: No evidence of thrombus. Normal compressibility and flow on color Doppler imaging. Superficial Great Saphenous Vein: No evidence of thrombus. Normal compressibility. Venous Reflux:  None. Other Findings: Minimal amount of subcutaneous edema is noted at the level the right lower leg. Pulsatile waveforms are demonstrated throughout near the entirety of the  right lower extremity venous system. LEFT LOWER EXTREMITY Common Femoral Vein: No evidence of thrombus. Normal compressibility, respiratory phasicity and response to augmentation. Saphenofemoral Junction: No evidence of thrombus. Normal compressibility and flow on color Doppler imaging. Profunda Femoral Vein: No evidence of thrombus. Normal compressibility and flow on color Doppler imaging. Femoral Vein: No evidence of thrombus. Normal compressibility, respiratory phasicity and response to augmentation. Popliteal Vein: No evidence of thrombus. Normal compressibility, respiratory phasicity and response to augmentation. Calf Veins: No evidence of thrombus. Normal compressibility and flow on color Doppler imaging. Superficial Great Saphenous  Vein: No evidence of thrombus. Normal compressibility. Venous Reflux:  None. Other Findings: Moderate amount of subcutaneous edema is noted at the level of the left lower leg. Pulsatile waveforms demonstrated throughout near the entirety of left lower extremity venous system. IMPRESSION: 1. No evidence of DVT within either lower extremity. 2. Pulsatile waveforms are demonstrated throughout near the entirety of the bilateral lower extremity venous systems, nonspecific though could be seen in the setting of right-sided heart failure. Clinical correlation is advised. Electronically Signed   By: Simonne Come M.D.   On: 02/20/2019 16:17   Dg Chest Port 1 View  Result Date: 02/20/2019 CLINICAL DATA:  Acute respiratory failure. EXAM: PORTABLE CHEST 1 VIEW COMPARISON:  Radiograph yesterday. FINDINGS: Mild cardiomegaly with no significant change from prior exam. Diffusely increased interstitial markings are again seen. Increased ill-defined infrahilar opacities since prior. Possible small left pleural effusion. No pneumothorax. IMPRESSION: 1. Cardiomegaly. Diffusely increased interstitial opacities similar to prior exam, pulmonary edema versus underlying chronic lung disease. 2.  Progressive patchy opacities in the bases may be vascular or airspace disease/atelectasis. Electronically Signed   By: Narda Rutherford M.D.   On: 02/20/2019 03:45     Medications:   . albumin human Stopped (02/20/19 2250)  . furosemide (LASIX) infusion 8 mg/hr (02/21/19 0742)  . magnesium sulfate bolus IVPB 2 g (02/21/19 0908)  . potassium chloride    . sodium chloride (hypertonic)     . Chlorhexidine Gluconate Cloth  6 each Topical Daily  . enoxaparin (LOVENOX) injection  40 mg Subcutaneous Q24H  . folic acid  1 mg Oral Daily  . mouth rinse  15 mL Mouth Rinse BID  . multivitamin with minerals  1 tablet Oral Daily  . pneumococcal 23 valent vaccine  0.5 mL Intramuscular Tomorrow-1000  . potassium chloride  40 mEq Oral BID  . potassium chloride  40 mEq Oral Q4H  . thiamine  100 mg Oral Daily   Or  . thiamine  100 mg Intravenous Daily  . zolpidem  10 mg Oral QHS   hydrALAZINE, LORazepam **OR** LORazepam, ondansetron **OR** ondansetron (ZOFRAN) IV  Assessment/ Plan:  63 y.o. male with a PMHx of diabetes mellitus type 2, hypertension, depression, hyperlipidemia, who was admitted to [facility] on 03-04-19 for evaluation of generalized weakness, shortness of breath, and signs of volume overload.  1.  Hyponatremia most likely due to volume overload. 2.  Pulmonary edema. 3.  Hypokalemia. 4.  Hypertension.  Plan: Serum sodium is rising slowly.  Sodium up to 120.  Continue furosemide drip at 8 mg/h.  Potassium noted to be low.  Patient is being given aggressive potassium repletion.  2D echocardiogram results still pending at the moment.  For now maintain the patient on Lasix drip and continue to monitor serum sodium closely.  Avoiding ADH antagonist given liver dysfunction.   LOS: 2 Matthew Washington 9/4/202010:00 AM

## 2019-02-21 NOTE — Progress Notes (Signed)
Initial Nutrition Assessment  DOCUMENTATION CODES:   Not applicable  INTERVENTION:   Ensure Enlive po BID, each supplement provides 350 kcal and 20 grams of protein  MVI and folic acid in setting of etoh abuse  Recommend 500 mg of thiamine intravenously, infused over 30 minutes, three times daily for two consecutive days and 250 mg intravenously once daily for an additional five days as pt at high risk for wet Beri Beri.  2 gram sodium diet  Pt at high refeed risk; recommend monitor K, Mg and P labs daily until stable  NUTRITION DIAGNOSIS:   Inadequate oral intake related to acute illness as evidenced by meal completion < 25%.  GOAL:   Patient will meet greater than or equal to 90% of their needs  MONITOR:   PO intake, Supplement acceptance, Labs, Weight trends, Skin, I & O's  REASON FOR ASSESSMENT:   Consult Poor PO  ASSESSMENT:   63 y.o. male with a PMHx of diabetes mellitus type 2, hypertension, depression, etoh abuse, hyperlipidemia, who was admitted to [facility] on 03/04/2019 for evaluation of generalized weakness, shortness of breath, and signs of volume overload.  RD working remotely.  Pt with poor appetite, nausea, weakness and diarrhea pta. Pt reports progressive neuropathy and states he has been unable to walk. Pt eating only sips and bites of meals yesterday but reports he is feeling better today and ate 100% of his breakfast. Suspect pt with poor appetite at baseline r/t etoh abuse. RD will add supplements to help pt meet his estimated needs. RD will also change pt to a 2 gram sodium diet. Pt is at high refeed risk; recommend monitor electrolytes. Pt at increased risk for wet beri beri secondary to etoh abuse; would recommend high dose IV thiamine administration. Pt currently diuresing. There is no documented recent weight history in chart but pt appears to have weight gain pta r/t edema.     Pt at high risk for malnutrition but unable to diagnose at this time as NFPE  cannot be performed.   Medications reviewed and include: lovenox, folic acid, MVI, KCl, thiamine, albumin, lasix, Mg sulfate  Labs reviewed: Na 120(L), K 3.2(L), P 3.3 wnl, Mg 1.5(L), alkphos 164(H), AST 117(H), ALT 107(H), Tbili 5.1(H)  UOP- 5.4L  Unable to complete Nutrition-Focused physical exam at this time.   Diet Order:   Diet Order            Diet heart healthy/carb modified Room service appropriate? Yes; Fluid consistency: Thin; Fluid restriction: 1200 mL Fluid  Diet effective now             EDUCATION NEEDS:   No education needs have been identified at this time  Skin:  Skin Assessment: Reviewed RN Assessment(laceration L thigh, non-pressure wound R foot)  Last BM:  9/4- type 5  Height:   Ht Readings from Last 1 Encounters:  03/04/19 6\' 2"  (1.88 m)    Weight:   Wt Readings from Last 1 Encounters:  03-04-19 99.7 kg    Ideal Body Weight:  86.3 kg  BMI:  Body mass index is 28.22 kg/m.  Estimated Nutritional Needs:   Kcal:  2100-2400kcal/day  Protein:  105-120g/day  Fluid:  1240ml/day per MD  Koleen Distance MS, RD, LDN Pager #- [phone] Office#- [phone] After Hours Pager: [phone]

## 2019-02-21 NOTE — Progress Notes (Signed)
PT Cancellation Note  Patient Details Name: Matthew Washington. MRN: [id number] DOB: 09/16/1955   Cancelled Treatment:    Reason Eval/Treat Not Completed: Other (comment). Consult received and chart reviewed. Pt currently with low sodium, 119, outside of guidelines for exertional activity. Will hold this date and re-attempt next available date.   [name] 02/21/2019, 1:32 PM  Greggory Stallion, PT, DPT [phone]

## 2019-02-21 NOTE — Progress Notes (Signed)
CRITICAL VALUE ALERT  Critical Value:  Na 119  Date & Time Notied:  02/21/2019 17:25  Provider Notified: Dr. Brett Albino  Orders Received/Actions taken: notified MD. No new orders received. Will continue to monitor.

## 2019-02-21 NOTE — Progress Notes (Signed)
Received report from [place] from ICU. Patient is in room 250 with roommate at bedside.

## 2019-02-21 NOTE — Progress Notes (Signed)
[employer] - [place] at [facility]   PATIENT NAME: Matthew Washington    MR#:  [id number]  DATE OF BIRTH:  1955/11/04  SUBJECTIVE:   Patient states he continues to feel better every day.  He has had very good urine output.  He has noticed that his lower extremity edema has improved.  He denies any chest pain or shortness of breath.  REVIEW OF SYSTEMS:  Review of Systems  Constitutional: Negative for chills and fever.  HENT: Negative for congestion and sore throat.   Eyes: Negative for blurred vision and double vision.  Respiratory: Negative for cough and shortness of breath.   Cardiovascular: Positive for leg swelling. Negative for chest pain and palpitations.  Gastrointestinal: Negative for nausea and vomiting.  Genitourinary: Negative for dysuria and urgency.  Musculoskeletal: Negative for back pain and neck pain.  Neurological: Negative for dizziness and headaches.  Psychiatric/Behavioral: Negative for depression. The patient is not nervous/anxious.     DRUG ALLERGIES:   Allergies  Allergen Reactions  . Shellfish Allergy Shortness Of Breath and Swelling  . Penicillins Hives  . Trazodone Other (See Comments)   VITALS:  Blood pressure 116/70, pulse 93, temperature (!) 97 F (36.1 C), temperature source Axillary, resp. rate 20, height 6\' 2"  (1.88 m), weight 99.7 kg, SpO2 93 %. PHYSICAL EXAMINATION:  Physical Exam  GENERAL:  Laying in the bed with no acute distress.  Sleepy but easily arousable. HEENT: Head atraumatic, normocephalic. Pupils equal, round, reactive to light and accommodation. No scleral icterus. Extraocular muscles intact. Oropharynx and nasopharynx clear.  NECK:  Supple, no jugular venous distention. No thyroid enlargement. LUNGS: + Bibasilar crackles present.  Nasal cannula in place. No use of accessory muscles of respiration.  CARDIOVASCULAR: RRR, S1, S2 normal. No murmurs, rubs, or gallops.  ABDOMEN: Soft, nontender, nondistended. Bowel  sounds present.  EXTREMITIES: No cyanosis, or clubbing. 2+ bilateral lower extremity pitting edema, improved from previous exam. NEUROLOGIC: CN 2-12 intact, no focal deficits. 5/5 muscle strength throughout all extremities. Sensation intact throughout. Gait not checked.  PSYCHIATRIC: The patient is alert and oriented x 3.  SKIN: No obvious rash, lesion, or ulcer.  LABORATORY PANEL:  Male CBC Recent Labs  Lab 02/21/19 0236  WBC 6.5  HGB 13.4  HCT 36.5*  PLT 138*   ------------------------------------------------------------------------------------------------------------------ Chemistries  Recent Labs  Lab 02/21/19 0329  02/21/19 1024  NA 119*   < > 119*  K 3.2*  --   --   CL 81*  --   --   CO2 24  --   --   GLUCOSE 122*  --   --   BUN 11  --   --   CREATININE 0.69  --   --   CALCIUM 8.3*  --   --   MG 1.5*  --   --   AST 117*  --   --   ALT 107*  --   --   ALKPHOS 164*  --   --   BILITOT 5.1*  --   --    < > = values in this interval not displayed.   RADIOLOGY:  US Venous Img Lower Bilateral  Result Date: 02/20/2019 CLINICAL DATA:  Bilateral lower extremity pain and edema. History of smoking. Evaluate for DVT. EXAM: BILATERAL LOWER EXTREMITY VENOUS DOPPLER ULTRASOUND TECHNIQUE: Gray-scale sonography with graded compression, as well as color Doppler and duplex ultrasound were performed to evaluate the lower extremity deep venous systems from the level of the  common femoral vein and including the common femoral, femoral, profunda femoral, popliteal and calf veins including the posterior tibial, peroneal and gastrocnemius veins when visible. The superficial great saphenous vein was also interrogated. Spectral Doppler was utilized to evaluate flow at rest and with distal augmentation maneuvers in the common femoral, femoral and popliteal veins. COMPARISON:  None. FINDINGS: RIGHT LOWER EXTREMITY Common Femoral Vein: No evidence of thrombus. Normal compressibility, respiratory  phasicity and response to augmentation. Saphenofemoral Junction: No evidence of thrombus. Normal compressibility and flow on color Doppler imaging. Profunda Femoral Vein: No evidence of thrombus. Normal compressibility and flow on color Doppler imaging. Femoral Vein: No evidence of thrombus. Normal compressibility, respiratory phasicity and response to augmentation. Popliteal Vein: No evidence of thrombus. Normal compressibility, respiratory phasicity and response to augmentation. Calf Veins: No evidence of thrombus. Normal compressibility and flow on color Doppler imaging. Superficial Great Saphenous Vein: No evidence of thrombus. Normal compressibility. Venous Reflux:  None. Other Findings: Minimal amount of subcutaneous edema is noted at the level the right lower leg. Pulsatile waveforms are demonstrated throughout near the entirety of the right lower extremity venous system. LEFT LOWER EXTREMITY Common Femoral Vein: No evidence of thrombus. Normal compressibility, respiratory phasicity and response to augmentation. Saphenofemoral Junction: No evidence of thrombus. Normal compressibility and flow on color Doppler imaging. Profunda Femoral Vein: No evidence of thrombus. Normal compressibility and flow on color Doppler imaging. Femoral Vein: No evidence of thrombus. Normal compressibility, respiratory phasicity and response to augmentation. Popliteal Vein: No evidence of thrombus. Normal compressibility, respiratory phasicity and response to augmentation. Calf Veins: No evidence of thrombus. Normal compressibility and flow on color Doppler imaging. Superficial Great Saphenous Vein: No evidence of thrombus. Normal compressibility. Venous Reflux:  None. Other Findings: Moderate amount of subcutaneous edema is noted at the level of the left lower leg. Pulsatile waveforms demonstrated throughout near the entirety of left lower extremity venous system. IMPRESSION: 1. No evidence of DVT within either lower extremity. 2.  Pulsatile waveforms are demonstrated throughout near the entirety of the bilateral lower extremity venous systems, nonspecific though could be seen in the setting of right-sided heart failure. Clinical correlation is advised. Electronically Signed   By: Sandi Mariscal M.D.   On: 02/20/2019 16:17   ASSESSMENT AND PLAN:   Acute hypoxic respiratory failure secondary to acute systolic heart failure- lower extremity edema is improving.  Requiring high flow nasal cannula overnight. -Continue Lasix drip and albumin 25 g IV every 12 hours -ECHO pending -Nephrology following  Hypervolemic hyponatremia- sodium continues to improve -Continue treatment as above  Hypokalemia/hypomagnesemia- likely due to being on Lasix drip -Replete and recheck  Elevated liver transaminases / hyperbilirubinemia- may be due to alcohol use -Check RUQ and hepatitis panel  Alcohol abuse -CIWA -MVI, folate, thiamine  Diarrhea- improving -Monitor -Can obtain C. Diff and GI pathogen panel if needed  Type 2 diabetes -SSI  Hypertension- BP well controlled -Holding home losartan  Hyperlipidemia-stable -Continue home simvastatin  DVT prophylaxis-Lovenox   All the records are reviewed and case discussed with Care Management/Social Worker. Management plans discussed with the patient, family and they are in agreement.  CODE STATUS: Full Code  TOTAL TIME TAKING CARE OF THIS PATIENT: 40 minutes.   More than 50% of the time was spent in counseling/coordination of care: YES  POSSIBLE D/C IN 3-5 DAYS, DEPENDING ON CLINICAL CONDITION.   Berna Spare Mayo M.D on 02/21/2019 at 3:15 PM  Between 7am to 6pm - Pager - [phone]  After 6pm go  to [email or web address] - Social research officer, governmentpassword [email or web address]  [facility] [place] [facility]  Office  [phone]  CC: Primary care physician; Patient, No Pcp Per  Note: This dictation was prepared with Dragon dictation along with smaller phrase technology. Any transcriptional errors that result  from this process are unintentional.

## 2019-02-21 NOTE — Progress Notes (Signed)
PHARMACY CONSULT NOTE - FOLLOW UP  Pharmacy Consult for Electrolyte Monitoring and Replacement   Recent Labs: Potassium (mmol/L)  Date Value  02/21/2019 3.7  04/16/2014 4.4   Magnesium (mg/dL)  Date Value  02/21/2019 2.0   Calcium (mg/dL)  Date Value  02/21/2019 8.3 (L)   Calcium, Total (mg/dL)  Date Value  04/16/2014 8.3 (L)   Albumin (g/dL)  Date Value  02/21/2019 3.6  01/16/2014 4.0   Phosphorus (mg/dL)  Date Value  02/21/2019 3.3   Sodium (mmol/L)  Date Value  02/21/2019 119 (LL)  11/11/2014 139  04/16/2014 137     Assessment: 63 year old male with h/o significant alcohol use presented with hyponatremia most likely due to volume overload. Suspect heart failure may be contributing. 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  Goal of Therapy:  Electrolytes WNL  Plan:  Furosemide drip continues at 8 mg/hr. Continues to have good urine output, renal function stable. Will not be starting hypertonic saline at this time.  K 3.7. KCL 62mEq ordered for 1800 and 2200. No additional replacement needed at this time.  Will recheck K+ and Mg with AM labs and continue to replace as needed.    Pernell Dupre ,PharmD Clinical Pharmacist 02/21/2019 5:29 PM

## 2019-02-21 NOTE — Progress Notes (Addendum)
Patient transferred to room 250. Report called to [facility] on 2A. Patient aware of transfer and declined for this RN to notify any family besides his roommate who was present for transfer. All belongings sent with patient.

## 2019-02-21 NOTE — Progress Notes (Signed)
PCCM notified via secure chat of CMP results.  Orders received.

## 2019-02-21 NOTE — Progress Notes (Signed)
PHARMACY CONSULT NOTE - FOLLOW UP  Pharmacy Consult for Electrolyte Monitoring and Replacement   Recent Labs: Potassium (mmol/L)  Date Value  02/21/2019 3.2 (L)  04/16/2014 4.4   Magnesium (mg/dL)  Date Value  02/21/2019 1.5 (L)   Calcium (mg/dL)  Date Value  02/21/2019 8.3 (L)   Calcium, Total (mg/dL)  Date Value  04/16/2014 8.3 (L)   Albumin (g/dL)  Date Value  02/21/2019 3.6  01/16/2014 4.0   Phosphorus (mg/dL)  Date Value  02/21/2019 3.3   Sodium (mmol/L)  Date Value  02/21/2019 119 (LL)  11/11/2014 139  04/16/2014 137     Assessment: 63 year old male with h/o significant alcohol use presented with hyponatremia most likely due to volume overload. Suspect heart failure may be contributing. 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  Goal of Therapy:  Electrolytes WNL  Plan:  Furosemide drip continues at 8 mg/hr. Continues to have good urine output, renal function stable. Will not be starting hypertonic saline at this time.  Mg 1.5 - Received 1 g IV this morning. Ordered an additional 2 g IV.  K 3.3 - Order for potassium 40 mEq PO x 2. I ordered an additional 40 mEq PO x 2 for a total of 160 mEq as K decreased despite 110 mEq K replacement. Nephrology ordered an additional 10 mEq IV x 4. Repeat K ordered for 1700. Will adjust potassium supplementation as necessary per level.   Tawnya Crook ,PharmD Clinical Pharmacist 02/21/2019 3:10 PM

## 2019-02-22 ENCOUNTER — Inpatient Hospital Stay: Payer: Medicaid Other

## 2019-02-22 LAB — CBC WITH DIFFERENTIAL/PLATELET
Abs Immature Granulocytes: 0.05 10*3/uL (ref 0.00–0.07)
Basophils Absolute: 0 10*3/uL (ref 0.0–0.1)
Basophils Relative: 0 %
Eosinophils Absolute: 0.1 10*3/uL (ref 0.0–0.5)
Eosinophils Relative: 1 %
HCT: 41.5 % (ref 39.0–52.0)
Hemoglobin: 14.8 g/dL (ref 13.0–17.0)
Immature Granulocytes: 1 %
Lymphocytes Relative: 7 %
Lymphs Abs: 0.7 10*3/uL (ref 0.7–4.0)
MCH: 31.9 pg (ref 26.0–34.0)
MCHC: 35.7 g/dL (ref 30.0–36.0)
MCV: 89.4 fL (ref 80.0–100.0)
Monocytes Absolute: 0.7 10*3/uL (ref 0.1–1.0)
Monocytes Relative: 7 %
Neutro Abs: 8 10*3/uL — ABNORMAL HIGH (ref 1.7–7.7)
Neutrophils Relative %: 84 %
Platelets: 132 10*3/uL — ABNORMAL LOW (ref 150–400)
RBC: 4.64 MIL/uL (ref 4.22–5.81)
RDW: 14.5 % (ref 11.5–15.5)
WBC: 9.5 10*3/uL (ref 4.0–10.5)
nRBC: 0 % (ref 0.0–0.2)

## 2019-02-22 LAB — COMPREHENSIVE METABOLIC PANEL
ALT: 92 U/L — ABNORMAL HIGH (ref 0–44)
AST: 98 U/L — ABNORMAL HIGH (ref 15–41)
Albumin: 3.7 g/dL (ref 3.5–5.0)
Alkaline Phosphatase: 146 U/L — ABNORMAL HIGH (ref 38–126)
Anion gap: 15 (ref 5–15)
BUN: 14 mg/dL (ref 8–23)
CO2: 23 mmol/L (ref 22–32)
Calcium: 8.8 mg/dL — ABNORMAL LOW (ref 8.9–10.3)
Chloride: 84 mmol/L — ABNORMAL LOW (ref 98–111)
Creatinine, Ser: 0.98 mg/dL (ref 0.61–1.24)
GFR calc Af Amer: >60 mL/min (ref >60)
GFR calc non Af Amer: >60 mL/min (ref >60)
Glucose, Bld: 142 mg/dL — ABNORMAL HIGH (ref 70–99)
Potassium: 4.6 mmol/L (ref 3.5–5.1)
Sodium: 122 mmol/L — ABNORMAL LOW (ref 135–145)
Total Bilirubin: 5.5 mg/dL — ABNORMAL HIGH (ref 0.3–1.2)
Total Protein: 6.9 g/dL (ref 6.5–8.1)

## 2019-02-22 LAB — PHOSPHORUS: Phosphorus: 2.4 mg/dL — ABNORMAL LOW (ref 2.5–4.6)

## 2019-02-22 LAB — GLUCOSE, CAPILLARY
Glucose-Capillary: 137 mg/dL — ABNORMAL HIGH (ref 70–99)
Glucose-Capillary: 207 mg/dL — ABNORMAL HIGH (ref 70–99)
Glucose-Capillary: 139 mg/dL — ABNORMAL HIGH (ref 70–99)
Glucose-Capillary: 225 mg/dL — ABNORMAL HIGH (ref 70–99)

## 2019-02-22 LAB — ECHOCARDIOGRAM COMPLETE
Height: 74 in
Weight: 3516.78 oz

## 2019-02-22 LAB — SODIUM: Sodium: 121 mmol/L — ABNORMAL LOW (ref 135–145)

## 2019-02-22 LAB — MAGNESIUM: Magnesium: 1.7 mg/dL (ref 1.7–2.4)

## 2019-02-22 MED ORDER — MAGNESIUM SULFATE 2 GM/50ML IV SOLN
2.0000 g | Freq: Once | INTRAVENOUS | Status: AC
  Administered 2019-02-22: 2 g via INTRAVENOUS
  Filled 2019-02-22: qty 50

## 2019-02-22 MED ORDER — SODIUM CHLORIDE 0.9 % IV SOLN
INTRAVENOUS | Status: DC | PRN
  Administered 2019-02-22: 250 mL via INTRAVENOUS
  Administered 2019-02-23: 500 mL via INTRAVENOUS
  Administered 2019-02-24: 100 mL via INTRAVENOUS

## 2019-02-22 MED ORDER — SODIUM CHLORIDE 1 G PO TABS
1.0000 g | ORAL_TABLET | Freq: Two times a day (BID) | ORAL | Status: DC
  Administered 2019-02-22 – 2019-02-23 (×3): 1 g via ORAL
  Filled 2019-02-22 (×4): qty 1

## 2019-02-22 NOTE — Progress Notes (Signed)
PHARMACY CONSULT NOTE - FOLLOW UP  Pharmacy Consult for Electrolyte Monitoring and Replacement   Recent Labs: Potassium (mmol/L)  Date Value  02/22/2019 4.6  04/16/2014 4.4   Magnesium (mg/dL)  Date Value  02/22/2019 1.7   Calcium (mg/dL)  Date Value  02/22/2019 8.8 (L)   Calcium, Total (mg/dL)  Date Value  04/16/2014 8.3 (L)   Albumin (g/dL)  Date Value  02/22/2019 3.7  01/16/2014 4.0   Phosphorus (mg/dL)  Date Value  02/22/2019 2.4 (L)   Sodium (mmol/L)  Date Value  02/22/2019 122 (L)  11/11/2014 139  04/16/2014 137     Assessment: 63 year old male with h/o significant alcohol use presented with hyponatremia most likely due to volume overload. Suspect heart failure may be contributing. 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  Goal of Therapy:  Electrolytes WNL  Plan:  Furosemide drip continues at 8 mg/hr. Continues to have good urine output, Scr trended up from 0.69>> 0.98.  Not starting hypertonic saline at this time. Na trending up slowly.   K 4.6, Mg: 1.7. Will order magnesium 2g IV x 1 dose.  No additional replacement needed at this time.  Will recheck electrolytes with AM labs and continue to replace as needed.   Pernell Dupre, PharmD, BCPS Clinical Pharmacist 02/22/2019 9:51 AM

## 2019-02-22 NOTE — Progress Notes (Addendum)
Physical Therapy Evaluation Patient Details Name: Matthew Washington. MRN: [id number] DOB: Nov 29, 1955 Today's Date: 02/22/2019   History of Present Illness  Matthew Washington  is a 63 y.o. male with a known history of diabetes mellitus, hypertension, hyperlipidemia and depression is brought into the ED for generalized weakness and unable to walk.  He reports that his lower extremities are swollen and has been experiencing diarrhea for the past 1 week . He was transfered to room 250 and now is ready for PT evaluation.   Clinical Impression  Patient was attempted for PT eval x 2. He had decreased O2 saturation both times. He has -3/5 BLE hip strength, 3/5 knee strength BLE and 3/5 ankle strength bilaterally. He needs mod assist for bed mobility and has fair sitting balance. His O2 saturation was 88% and then the second attempt it was 80%. He will benefit from skilled PT to improve his strength and mobility.     Follow Up Recommendations SNF    Equipment Recommendations       Recommendations for Other Services       Precautions / Restrictions Restrictions Weight Bearing Restrictions: No      Mobility  Bed Mobility Overal bed mobility: Needs Assistance Bed Mobility: Supine to Sit     Supine to sit: Min assist     General bed mobility comments: needs VC for sequencing  Transfers Overall transfer level: Needs assistance               General transfer comment: O2 saturation 80% in sitting   Ambulation/Gait Ambulation/Gait assistance: (NT due to weakness and decreased O2 saturation level 80%)              Stairs            Wheelchair Mobility    Modified Rankin (Stroke Patients Only)       Balance Overall balance assessment: Modified Independent                                           Pertinent Vitals/Pain Pain Assessment: No/denies pain    Home Living Family/patient expects to be discharged to:: Private residence Living  Arrangements: Spouse/significant other Available Help at Discharge: Friend(s)   Home Access: Stairs to enter Entrance Stairs-Rails: Right Entrance Stairs-Number of Steps: 3 flights Home Layout: One level Home Equipment: Environmental consultant - 2 wheels      Prior Function Level of Independence: Independent with assistive device(s)               Hand Dominance        Extremity/Trunk Assessment   Upper Extremity Assessment Upper Extremity Assessment: Generalized weakness    Lower Extremity Assessment Lower Extremity Assessment: Generalized weakness       Communication   Communication: No difficulties  Cognition Arousal/Alertness: Awake/alert Behavior During Therapy: Flat affect Overall Cognitive Status: Within Functional Limits for tasks assessed                                        General Comments General comments (skin integrity, edema, etc.): many bruises BLE    Exercises     Assessment/Plan    PT Assessment Patient needs continued PT services  PT Problem List Decreased strength;Decreased activity tolerance;Decreased balance;Decreased mobility;Decreased skin integrity  PT Treatment Interventions Gait training;Therapeutic activities;Therapeutic exercise;Balance training    PT Goals (Current goals can be found in the Care Plan section)  Acute Rehab PT Goals Patient Stated Goal: to get stronger PT Goal Formulation: With patient Time For Goal Achievement: 03/08/19 Potential to Achieve Goals: Good    Frequency Min 2X/week   Barriers to discharge   (3 flights of stairs to get into his house)    Co-evaluation               AM-PAC PT "6 Clicks" Mobility  Outcome Measure Help needed turning from your back to your side while in a flat bed without using bedrails?: A Little Help needed moving from lying on your back to sitting on the side of a flat bed without using bedrails?: A Lot Help needed moving to and from a bed to a chair  (including a wheelchair)?: A Lot Help needed standing up from a chair using your arms (e.g., wheelchair or bedside chair)?: A Lot Help needed to walk in hospital room?: A Lot Help needed climbing 3-5 steps with a railing? : Total 6 Click Score: 12    End of Session Equipment Utilized During Treatment: Gait belt Activity Tolerance: Patient limited by fatigue Patient left: in bed;with call bell/phone within reach   PT Visit Diagnosis: Unsteadiness on feet (R26.81);Other abnormalities of gait and mobility (R26.89);Muscle weakness (generalized) (M62.81);Difficulty in walking, not elsewhere classified (R26.2)    Time: 1414-1440 PT Time Calculation (min) (ACUTE ONLY): 26 min   Charges:   PT Evaluation $PT Eval Low Complexity: 1 Low            [place] Proving GroundMansfield, Sharion SettlerKristine S, PT DPT 02/22/2019, 3:51 PM

## 2019-02-22 NOTE — Progress Notes (Signed)
[facility] [place] NAME: Matthew Washington    MR#:  [id number]  DATE OF BIRTH:  1955/10/13  SUBJECTIVE:   Patient states he continues to feel weak this morning, but does feel better than when he first came in.  He feels like his lower extremity edema is improving.  He endorses some increased thirst.  No other concerns this morning.  REVIEW OF SYSTEMS:  Review of Systems  Constitutional: Negative for chills and fever.  HENT: Negative for congestion and sore throat.   Eyes: Negative for blurred vision and double vision.  Respiratory: Negative for cough and shortness of breath.   Cardiovascular: Positive for leg swelling. Negative for chest pain and palpitations.  Gastrointestinal: Negative for nausea and vomiting.  Genitourinary: Negative for dysuria and urgency.  Musculoskeletal: Negative for back pain and neck pain.  Neurological: Negative for dizziness and headaches.  Psychiatric/Behavioral: Negative for depression. The patient is not nervous/anxious.     DRUG ALLERGIES:   Allergies  Allergen Reactions  . Shellfish Allergy Shortness Of Breath and Swelling  . Penicillins Hives  . Trazodone Other (See Comments)   VITALS:  Blood pressure (!) 137/93, pulse 99, temperature 98.2 F (36.8 C), temperature source Oral, resp. rate 19, height 6\' 2"  (1.88 m), weight 93.3 kg, SpO2 96 %. PHYSICAL EXAMINATION:  Physical Exam  GENERAL:  Laying in the bed with no acute distress.  Sleepy but easily arousable. HEENT: Head atraumatic, normocephalic. Pupils equal, round, reactive to light and accommodation. No scleral icterus. Extraocular muscles intact. Oropharynx and nasopharynx clear. + Dry mucous membranes. NECK:  Supple, no jugular venous distention. No thyroid enlargement. LUNGS: + Bibasilar crackles present.  Nasal cannula in place. No use of accessory muscles of respiration.  CARDIOVASCULAR: RRR, S1, S2 normal. No murmurs, rubs, or gallops.   ABDOMEN: Soft, nontender, nondistended. Bowel sounds present.  EXTREMITIES: No cyanosis, or clubbing. 1+ bilateral lower extremity pitting edema, improved from previous exam. NEUROLOGIC: CN 2-12 intact, no focal deficits. +global weakness. Sensation intact throughout. Gait not checked.  PSYCHIATRIC: The patient is alert and oriented x 3.  SKIN: No obvious rash, lesion, or ulcer.  LABORATORY PANEL:  Male CBC Recent Labs  Lab 02/22/19 0748  WBC 9.5  HGB 14.8  HCT 41.5  PLT 132*   ------------------------------------------------------------------------------------------------------------------ Chemistries  Recent Labs  Lab 02/22/19 0748  NA 122*  K 4.6  CL 84*  CO2 23  GLUCOSE 142*  BUN 14  CREATININE 0.98  CALCIUM 8.8*  MG 1.7  AST 98*  ALT 92*  ALKPHOS 146*  BILITOT 5.5*   RADIOLOGY:  US Abdomen Limited Ruq  Result Date: 02/22/2019 CLINICAL DATA:  Elevated liver function test. EXAM: ULTRASOUND ABDOMEN LIMITED RIGHT UPPER QUADRANT COMPARISON:  None. FINDINGS: Gallbladder: There is some sludge in the gallbladder. No pericholecystic fluid. Gallbladder wall is minimally thickened at 0.4 cm but the gallbladder is incompletely distended. Sonographer reports negative Murphy's sign. Common bile duct: Diameter: 0.3 cm Liver: The liver border appears somewhat lobulated and parenchymal echogenicity is increased throughout. No focal lesion or intrahepatic biliary ductal dilatation. Portal vein is patent on color Doppler imaging with normal direction of blood flow towards the liver. Other: None. IMPRESSION: Small volume of gallbladder sludge without evidence of cholecystitis. Fatty infiltration of the liver. The liver border appears mildly lobulated suggestive of cirrhosis. Electronically Signed   By: Inge Rise M.D.   On: 02/22/2019 10:00   ASSESSMENT AND PLAN:   Acute hypoxic  respiratory failure secondary to acute systolic heart failure- lower extremity edema is improving. Weaned  down to 6L O2 this morning. UOP 2.35L in 24 hours. 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 Diff and GI pathogen panel   Type 2 diabetes -SSI  Hypertension- BP well controlled -Holding home losartan  Hyperlipidemia-stable -Continue home simvastatin  DVT prophylaxis-Lovenox   All the records are reviewed and case discussed with Care Management/Social Worker. Management plans discussed with the patient, family and they are in agreement.  CODE STATUS: Full Code  TOTAL TIME TAKING CARE OF THIS PATIENT: 40 minutes.   More than 50% of the time was spent in counseling/coordination of care: YES  POSSIBLE D/C IN 3-4 DAYS, DEPENDING ON CLINICAL CONDITION.   Jinny BlossomKaty D Mayo M.D on 02/22/2019 at 4:02 PM  Between 7am to 6pm - Pager [phone]- [phone]  After 6pm go to [email or web address] - Social research officer, governmentpassword EPAS [facility]  [facility] [place] [facility]  Office  [phone]  CC: Primary care physician; Patient, No Pcp Per  Note: This dictation was prepared with Dragon dictation along with smaller phrase technology. Any transcriptional errors that result from this process are unintentional.

## 2019-02-22 NOTE — Progress Notes (Signed)
[facility]  ROUNDING NOTE   Subjective:  Patient seen at bedside. Urine output yesterday was 2.1 L over the preceding 24 hours. Still on furosemide drip. Also receiving albumin. Serum sodium coming up very slowly. Serum sodium now up to 122.    Objective:  Vital signs in last 24 hours:  Temp:  [97 F (36.1 C)-98.4 F (36.9 C)] 98.2 F (36.8 C) (09/05 0742) Pulse Rate:  [89-105] 99 (09/05 0742) Resp:  [18-29] 19 (09/05 0742) BP: (113-137)/(70-93) 137/93 (09/05 0742) SpO2:  [90 %-99 %] 96 % (09/05 0742) Weight:  [93.3 kg-93.8 kg] 93.3 kg (09/05 0317)  Weight change:  Filed Weights   2019/04/29 1800 02/21/19 1556 02/22/19 0317  Weight: 99.7 kg 93.8 kg 93.3 kg    Intake/Output: I/O last 3 completed shifts: In: 1782.6 [P.O.:960; I.V.:159.7; IV Piggyback:662.8] Out: 4975 [Urine:4975]   Intake/Output this shift:  Total I/O In: 297.5 [I.V.:155.5; IV Piggyback:142] Out: 0   Physical Exam: General: No acute distress  Head: Normocephalic, atraumatic. Moist oral mucosal membranes  Eyes: Anicteric  Neck: Supple, trachea midline  Lungs:  Basilar rales, normal effort  Heart: S1S2 no rubs  Abdomen:  Soft, nontender, bowel sounds present  Extremities: 2+ peripheral edema.  Neurologic: Awake, alert, following commands  Skin: No lesions       Basic Metabolic Panel: Recent Labs  Lab 2019/04/29 1429  02/20/19 0605  02/20/19 1813 02/20/19 2233 02/21/19 0329 02/21/19 0632 02/21/19 1024 02/21/19 1421 02/22/19 0748  NA 114*   < > 117*   < > 118* 118* 119* 120* 119*  --  122*  K 3.7  --  3.3*  --  3.7  --  3.2*  --   --  3.7 4.6  CL 79*  --  82*  --   --   --  81*  --   --   --  84*  CO2 20*  --  21*  --   --   --  24  --   --   --  23  GLUCOSE 125*  --  111*  --   --   --  122*  --   --   --  142*  BUN 16  --  14  --   --   --  11  --   --   --  14  CREATININE 0.91  --  0.72  --   --   --  0.69  --   --   --  0.98  CALCIUM 8.8*  --  8.4*  --   --   --  8.3*   --   --   --  8.8*  MG  --   --  1.4*  --   --   --  1.5*  --   --  2.0 1.7  PHOS  --   --  3.3  --   --   --  3.3  --   --   --  2.4*   < > = values in this interval not displayed.    Liver Function Tests: Recent Labs  Lab 2019/04/29 1429 02/20/19 0605 02/21/19 0329 02/22/19 0748  AST 257* 155* 117* 98*  ALT 189* 136* 107* 92*  ALKPHOS 152* 133* 164* 146*  BILITOT 5.0* 4.5* 5.1* 5.5*  PROT 7.5 6.6 6.8 6.9  ALBUMIN 3.4* 3.0* 3.6 3.7   No results for input(s): LIPASE, AMYLASE in the last 168 hours. No results for input(s): AMMONIA  in the last 168 hours.  CBC: Recent Labs  Lab 03/12/2019 1429 02/20/19 0605 02/21/19 0236 02/22/19 0748  WBC 8.0 7.7 6.5 9.5  NEUTROABS 6.7 6.7 5.5 8.0*  HGB 15.2 14.3 13.4 14.8  HCT 41.0 38.4* 36.5* 41.5  MCV 88.7 88.1 88.4 89.4  PLT 155 150 138* 132*    Cardiac Enzymes: No results for input(s): CKTOTAL, CKMB, CKMBINDEX, TROPONINI in the last 168 hours.  BNP: Invalid input(s): POCBNP  CBG: Recent Labs  Lab 03/04/2019 1739 02/21/19 1638 02/21/19 2201 02/22/19 0744  GLUCAP 106* 194* 190* 137*    Microbiology: Results for orders placed or performed during the hospital encounter of 03/03/2019  SARS Coronavirus 2 [facility] order, Performed in [facility] hospital lab) Nasopharyngeal Nasopharyngeal Swab     Status: None   Collection Time: 02/23/2019  2:33 PM   Specimen: Nasopharyngeal Swab  Result Value Ref Range Status   SARS Coronavirus 2 NEGATIVE NEGATIVE Final    Comment: (NOTE) If result is NEGATIVE SARS-CoV-2 target nucleic acids are NOT DETECTED. The SARS-CoV-2 RNA is generally detectable in upper and lower  respiratory specimens during the acute phase of infection. The lowest  concentration of SARS-CoV-2 viral copies this assay can detect is 250  copies / mL. A negative result does not preclude SARS-CoV-2 infection  and should not be used as the sole basis for treatment or other  patient management decisions.  A negative result  may occur with  improper specimen collection / handling, submission of specimen other  than nasopharyngeal swab, presence of viral mutation(s) within the  areas targeted by this assay, and inadequate number of viral copies  (<250 copies / mL). A negative result must be combined with clinical  observations, patient history, and epidemiological information. If result is POSITIVE SARS-CoV-2 target nucleic acids are DETECTED. The SARS-CoV-2 RNA is generally detectable in upper and lower  respiratory specimens dur ing the acute phase of infection.  Positive  results are indicative of active infection with SARS-CoV-2.  Clinical  correlation with patient history and other diagnostic information is  necessary to determine patient infection status.  Positive results do  not rule out bacterial infection or co-infection with other viruses. If result is PRESUMPTIVE POSTIVE SARS-CoV-2 nucleic acids MAY BE PRESENT.   A presumptive positive result was obtained on the submitted specimen  and confirmed on repeat testing.  While 2019 novel coronavirus  (SARS-CoV-2) nucleic acids may be present in the submitted sample  additional confirmatory testing may be necessary for epidemiological  and / or clinical management purposes  to differentiate between  SARS-CoV-2 and other Sarbecovirus currently known to infect humans.  If clinically indicated additional testing with an alternate test  methodology [phone]) is advised. The SARS-CoV-2 RNA is generally  detectable in upper and lower respiratory sp ecimens during the acute  phase of infection. The expected result is Negative. Fact Sheet for Patients:  BoilerBrush.com.cy Fact Sheet for Healthcare Providers: https://pope.com/ This test is not yet approved or cleared by the [place] FDA and has been authorized for detection and/or diagnosis of SARS-CoV-2 by FDA under an Emergency Use Authorization (EUA).   This EUA will remain in effect (meaning this test can be used) for the duration of the COVID-19 declaration under Section 564(b)(1) of the Act, 21 U.S.C. section 360bbb-3(b)(1), unless the authorization is terminated or revoked sooner. Performed at [facility], [address], [place], [place] [address]   MRSA PCR Screening     Status: None   Collection  Time: 02/21/2019  6:27 PM   Specimen: Nasopharyngeal  Result Value Ref Range Status   MRSA by PCR NEGATIVE NEGATIVE Final    Comment:        The GeneXpert MRSA Assay (FDA approved for NASAL specimens only), is one component of a comprehensive MRSA colonization surveillance program. It is not intended to diagnose MRSA infection nor to guide or monitor treatment for MRSA infections. Performed at [facility], [address] [place],  [address]     Coagulation Studies: Recent Labs    03/09/2019 1429  LABPROT 22.5*  INR 2.0*    Urinalysis: Recent Labs    03/15/2019 1433  COLORURINE AMBER*  LABSPEC 1.024  PHURINE 5.0  GLUCOSEU NEGATIVE  HGBUR NEGATIVE  BILIRUBINUR MODERATE*  KETONESUR 5*  PROTEINUR 100*  NITRITE NEGATIVE  LEUKOCYTESUR NEGATIVE      Imaging: US Venous Img Lower Bilateral  Result Date: 02/20/2019 CLINICAL DATA:  Bilateral lower extremity pain and edema. History of smoking. Evaluate for DVT. EXAM: BILATERAL LOWER EXTREMITY VENOUS DOPPLER ULTRASOUND TECHNIQUE: Gray-scale sonography with graded compression, as well as color Doppler and duplex ultrasound were performed to evaluate the lower extremity deep venous systems from the level of the common femoral vein and including the common femoral, femoral, profunda femoral, popliteal and calf veins including the posterior tibial, peroneal and gastrocnemius veins when visible. The superficial great saphenous vein was also interrogated. Spectral Doppler was utilized to evaluate flow at rest and with distal augmentation maneuvers in the  common femoral, femoral and popliteal veins. COMPARISON:  None. FINDINGS: RIGHT LOWER EXTREMITY Common Femoral Vein: No evidence of thrombus. Normal compressibility, respiratory phasicity and response to augmentation. Saphenofemoral Junction: No evidence of thrombus. Normal compressibility and flow on color Doppler imaging. Profunda Femoral Vein: No evidence of thrombus. Normal compressibility and flow on color Doppler imaging. Femoral Vein: No evidence of thrombus. Normal compressibility, respiratory phasicity and response to augmentation. Popliteal Vein: No evidence of thrombus. Normal compressibility, respiratory phasicity and response to augmentation. Calf Veins: No evidence of thrombus. Normal compressibility and flow on color Doppler imaging. Superficial Great Saphenous Vein: No evidence of thrombus. Normal compressibility. Venous Reflux:  None. Other Findings: Minimal amount of subcutaneous edema is noted at the level the right lower leg. Pulsatile waveforms are demonstrated throughout near the entirety of the right lower extremity venous system. LEFT LOWER EXTREMITY Common Femoral Vein: No evidence of thrombus. Normal compressibility, respiratory phasicity and response to augmentation. Saphenofemoral Junction: No evidence of thrombus. Normal compressibility and flow on color Doppler imaging. Profunda Femoral Vein: No evidence of thrombus. Normal compressibility and flow on color Doppler imaging. Femoral Vein: No evidence of thrombus. Normal compressibility, respiratory phasicity and response to augmentation. Popliteal Vein: No evidence of thrombus. Normal compressibility, respiratory phasicity and response to augmentation. Calf Veins: No evidence of thrombus. Normal compressibility and flow on color Doppler imaging. Superficial Great Saphenous Vein: No evidence of thrombus. Normal compressibility. Venous Reflux:  None. Other Findings: Moderate amount of subcutaneous edema is noted at the level of the left  lower leg. Pulsatile waveforms demonstrated throughout near the entirety of left lower extremity venous system. IMPRESSION: 1. No evidence of DVT within either lower extremity. 2. Pulsatile waveforms are demonstrated throughout near the entirety of the bilateral lower extremity venous systems, nonspecific though could be seen in the setting of right-sided heart failure. Clinical correlation is advised. Electronically Signed   By: Sandi Mariscal M.D.   On: 02/20/2019 16:17   US Abdomen Limited Ruq  Result Date: 02/22/2019 CLINICAL DATA:  Elevated liver function test. EXAM: ULTRASOUND ABDOMEN LIMITED RIGHT UPPER QUADRANT COMPARISON:  None. FINDINGS: Gallbladder: There is some sludge in the gallbladder. No pericholecystic fluid. Gallbladder wall is minimally thickened at 0.4 cm but the gallbladder is incompletely distended. Sonographer reports negative Murphy's sign. Common bile duct: Diameter: 0.3 cm Liver: The liver border appears somewhat lobulated and parenchymal echogenicity is increased throughout. No focal lesion or intrahepatic biliary ductal dilatation. Portal vein is patent on color Doppler imaging with normal direction of blood flow towards the liver. Other: None. IMPRESSION: Small volume of gallbladder sludge without evidence of cholecystitis. Fatty infiltration of the liver. The liver border appears mildly lobulated suggestive of cirrhosis. Electronically Signed   By: Drusilla Kannerhomas  Dalessio M.D.   On: 02/22/2019 10:00     Medications:   . albumin human Stopped (02/22/19 1017)  . furosemide (LASIX) infusion 8 mg/hr (02/22/19 1114)  . magnesium sulfate bolus IVPB    . [START ON 02/24/2019] thiamine injection    . thiamine injection 500 mg (02/21/19 2233)   . enoxaparin (LOVENOX) injection  40 mg Subcutaneous Q24H  . feeding supplement  1 Container Oral TID BM  . folic acid  1 mg Oral Daily  . insulin aspart  0-5 Units Subcutaneous QHS  . insulin aspart  0-9 Units Subcutaneous TID WC  . mouth rinse   15 mL Mouth Rinse BID  . multivitamin with minerals  1 tablet Oral Daily  . pneumococcal 23 valent vaccine  0.5 mL Intramuscular Tomorrow-1000  . zolpidem  10 mg Oral QHS   hydrALAZINE, LORazepam **OR** LORazepam, ondansetron **OR** ondansetron (ZOFRAN) IV  Assessment/ Plan:  63 y.o. male with a PMHx of diabetes mellitus type 2, hypertension, depression, hyperlipidemia, who was [facility] to [facility] on [facility]11[facility]2019 for evaluation of generalized weakness, shortness of breath, and signs of volume overload.  1.  Hyponatremia most likely due to volume overload. 2.  Pulmonary edema, improved.  3.  Hypokalemia. 4.  Hypertension.  Plan: Patient is having good diuretic response.  Serum sodium however coming up very slowly at 122.  For a very short period of time we will add salt tablets 1000 mg twice daily while we continue diuresis and albumin infusions. If he does have any shortness of breath would keep low threshold to discontinue the salt tablets.  Unfortunately cannot administer ADH antagonist given liver dysfunction.  Further plan as patient progresses.   LOS: 3 Miosotis Wetsel 9/5/202011:16 AM

## 2019-02-23 ENCOUNTER — Inpatient Hospital Stay: Payer: Medicaid Other

## 2019-02-23 LAB — BASIC METABOLIC PANEL
Anion gap: 11 (ref 5–15)
BUN: 16 mg/dL (ref 8–23)
CO2: 27 mmol/L (ref 22–32)
Calcium: 8.8 mg/dL — ABNORMAL LOW (ref 8.9–10.3)
Chloride: 86 mmol/L — ABNORMAL LOW (ref 98–111)
Creatinine, Ser: 0.83 mg/dL (ref 0.61–1.24)
GFR calc Af Amer: >60 mL/min (ref >60)
GFR calc non Af Amer: >60 mL/min (ref >60)
Glucose, Bld: 121 mg/dL — ABNORMAL HIGH (ref 70–99)
Potassium: 3.7 mmol/L (ref 3.5–5.1)
Sodium: 124 mmol/L — ABNORMAL LOW (ref 135–145)

## 2019-02-23 LAB — MAGNESIUM: Magnesium: 1.7 mg/dL (ref 1.7–2.4)

## 2019-02-23 LAB — GLUCOSE, CAPILLARY
Glucose-Capillary: 118 mg/dL — ABNORMAL HIGH (ref 70–99)
Glucose-Capillary: 120 mg/dL — ABNORMAL HIGH (ref 70–99)
Glucose-Capillary: 193 mg/dL — ABNORMAL HIGH (ref 70–99)
Glucose-Capillary: 147 mg/dL — ABNORMAL HIGH (ref 70–99)
Glucose-Capillary: 128 mg/dL — ABNORMAL HIGH (ref 70–99)

## 2019-02-23 LAB — CBC
HCT: 37.8 % — ABNORMAL LOW (ref 39.0–52.0)
Hemoglobin: 13.4 g/dL (ref 13.0–17.0)
MCH: 32 pg (ref 26.0–34.0)
MCHC: 35.4 g/dL (ref 30.0–36.0)
MCV: 90.2 fL (ref 80.0–100.0)
Platelets: 122 10*3/uL — ABNORMAL LOW (ref 150–400)
RBC: 4.19 MIL/uL — ABNORMAL LOW (ref 4.22–5.81)
RDW: 14.6 % (ref 11.5–15.5)
WBC: 10.4 10*3/uL (ref 4.0–10.5)
nRBC: 0 % (ref 0.0–0.2)

## 2019-02-23 LAB — BLOOD GAS, ARTERIAL
Acid-Base Excess: 5.1 mmol/L — ABNORMAL HIGH (ref 0.0–2.0)
Bicarbonate: 27.8 mmol/L (ref 20.0–28.0)
O2 Saturation: 96.9 %
Patient temperature: 37
pCO2 arterial: 34 mmHg (ref 32.0–48.0)
pH, Arterial: 7.52 — ABNORMAL HIGH (ref 7.350–7.450)
pO2, Arterial: 80 mmHg — ABNORMAL LOW (ref 83.0–108.0)

## 2019-02-23 LAB — SODIUM
Sodium: 122 mmol/L — ABNORMAL LOW (ref 135–145)
Sodium: 123 mmol/L — ABNORMAL LOW (ref 135–145)

## 2019-02-23 LAB — PHOSPHORUS: Phosphorus: 3.7 mg/dL (ref 2.5–4.6)

## 2019-02-23 MED ORDER — CHLORHEXIDINE GLUCONATE CLOTH 2 % EX PADS
6.0000 | MEDICATED_PAD | Freq: Every day | CUTANEOUS | Status: DC
  Administered 2019-02-24 – 2019-02-25 (×2): 6 via TOPICAL

## 2019-02-23 MED ORDER — MAGNESIUM SULFATE 2 GM/50ML IV SOLN
2.0000 g | Freq: Once | INTRAVENOUS | Status: AC
  Administered 2019-02-23: 2 g via INTRAVENOUS
  Filled 2019-02-23: qty 50

## 2019-02-23 MED ORDER — CARVEDILOL 3.125 MG PO TABS
3.1250 mg | ORAL_TABLET | Freq: Two times a day (BID) | ORAL | Status: DC
  Administered 2019-02-23 – 2019-02-26 (×5): 3.125 mg via ORAL
  Filled 2019-02-23 (×5): qty 1

## 2019-02-23 MED ORDER — SACUBITRIL-VALSARTAN 24-26 MG PO TABS
1.0000 | ORAL_TABLET | Freq: Two times a day (BID) | ORAL | Status: DC
  Administered 2019-02-23 – 2019-02-26 (×6): 1 via ORAL
  Filled 2019-02-23 (×7): qty 1

## 2019-02-23 MED ORDER — DEXTROSE 5 % IV SOLN
10.0000 mg/kg | Freq: Three times a day (TID) | INTRAVENOUS | Status: DC
  Administered 2019-02-23 – 2019-02-25 (×5): 910 mg via INTRAVENOUS
  Filled 2019-02-23 (×9): qty 18.2

## 2019-02-23 MED ORDER — ZOLPIDEM TARTRATE 5 MG PO TABS
5.0000 mg | ORAL_TABLET | Freq: Every day | ORAL | Status: DC
  Administered 2019-02-23 – 2019-02-25 (×3): 5 mg via ORAL
  Filled 2019-02-23 (×3): qty 1

## 2019-02-23 NOTE — Progress Notes (Signed)
[facility]  ROUNDING NOTE   Subjective:  Sodium is rising but very slowly. Serum sodium up to 124. Suspect that urine output was inaccurate yesterday as reported urine output was only 850 cc. Nursing assistant reports that he was leaking some urine previously.    Objective:  Vital signs in last 24 hours:  Temp:  [97.5 F (36.4 C)-98.6 F (37 C)] 98.3 F (36.8 C) (09/06 0804) Pulse Rate:  [98-109] 98 (09/06 0804) Resp:  [16-20] 20 (09/06 0804) BP: (128-139)/(77-88) 139/86 (09/06 0804) SpO2:  [83 %-100 %] 94 % (09/06 0804) Weight:  [91 kg] 91 kg (09/06 0337)  Weight change: -2.767 kg Filed Weights   02/21/19 1556 02/22/19 0317 02/23/19 0337  Weight: 93.8 kg 93.3 kg 91 kg    Intake/Output: I/O last 3 completed shifts: In: 922.5 [I.V.:357.2; IV Piggyback:565.4] Out: 1250 [Urine:1250]   Intake/Output this shift:  No intake/output data recorded.  Physical Exam: General: No acute distress  Head: Normocephalic, atraumatic. Moist oral mucosal membranes  Eyes: Mild icterus noted  Neck: Supple, trachea midline  Lungs:  Basilar rales, normal effort  Heart: S1S2 no rubs  Abdomen:  Soft, nontender, bowel sounds present  Extremities: 2+ peripheral edema.  Neurologic: Awake, alert, following commands  Skin: No lesions       Basic Metabolic Panel: Recent Labs  Lab 02/23/2019 1429  02/20/19 0605  02/20/19 1813  02/21/19 0329  02/21/19 1024 02/21/19 1421 02/22/19 0748 02/22/19 1547 02/22/19 2351 02/23/19 0743  NA 114*   < > 117*   < > 118*   < > 119*   < > 119*  --  122* 121* 122* 124*  K 3.7  --  3.3*  --  3.7  --  3.2*  --   --  3.7 4.6  --   --  3.7  CL 79*  --  82*  --   --   --  81*  --   --   --  84*  --   --  86*  CO2 20*  --  21*  --   --   --  24  --   --   --  23  --   --  27  GLUCOSE 125*  --  111*  --   --   --  122*  --   --   --  142*  --   --  121*  BUN 16  --  14  --   --   --  11  --   --   --  14  --   --  16  CREATININE 0.91  --  0.72   --   --   --  0.69  --   --   --  0.98  --   --  0.83  CALCIUM 8.8*  --  8.4*  --   --   --  8.3*  --   --   --  8.8*  --   --  8.8*  MG  --   --  1.4*  --   --   --  1.5*  --   --  2.0 1.7  --   --  1.7  PHOS  --   --  3.3  --   --   --  3.3  --   --   --  2.4*  --   --  3.7   < > = values in this interval not  displayed.    Liver Function Tests: Recent Labs  Lab July 04, 2018 1429 02/20/19 0605 02/21/19 0329 02/22/19 0748  AST 257* 155* 117* 98*  ALT 189* 136* 107* 92*  ALKPHOS 152* 133* 164* 146*  BILITOT 5.0* 4.5* 5.1* 5.5*  PROT 7.5 6.6 6.8 6.9  ALBUMIN 3.4* 3.0* 3.6 3.7   No results for input(s): LIPASE, AMYLASE in the last 168 hours. No results for input(s): AMMONIA in the last 168 hours.  CBC: Recent Labs  Lab July 04, 2018 1429 02/20/19 0605 02/21/19 0236 02/22/19 0748 02/23/19 0743  WBC 8.0 7.7 6.5 9.5 10.4  NEUTROABS 6.7 6.7 5.5 8.0*  --   HGB 15.2 14.3 13.4 14.8 13.4  HCT 41.0 38.4* 36.5* 41.5 37.8*  MCV 88.7 88.1 88.4 89.4 90.2  PLT 155 150 138* 132* 122*    Cardiac Enzymes: No results for input(s): CKTOTAL, CKMB, CKMBINDEX, TROPONINI in the last 168 hours.  BNP: Invalid input(s): POCBNP  CBG: Recent Labs  Lab 02/22/19 1215 02/22/19 1631 02/22/19 2137 02/23/19 0805 02/23/19 1115  GLUCAP 207* 139* 225* 118* 120*    Microbiology: Results for orders placed or performed during the hospital encounter of July 04, 2018  SARS Coronavirus 2 [facility] order, Performed in [facility] lab) Nasopharyngeal Nasopharyngeal Swab     Status: None   Collection Time: July 04, 2018  2:33 PM   Specimen: Nasopharyngeal Swab  Result Value Ref Range Status   SARS Coronavirus 2 NEGATIVE NEGATIVE Final    Comment: (NOTE) If result is NEGATIVE SARS-CoV-2 target nucleic acids are NOT DETECTED. The SARS-CoV-2 RNA is generally detectable in upper and lower  respiratory specimens during the acute phase of infection. The lowest  concentration of SARS-CoV-2 viral copies this  assay can detect is 250  copies / mL. A negative result does not preclude SARS-CoV-2 infection  and should not be used as the sole basis for treatment or other  patient management decisions.  A negative result may occur with  improper specimen collection / handling, submission of specimen other  than nasopharyngeal swab, presence of viral mutation(s) within the  areas targeted by this assay, and inadequate number of viral copies  (<250 copies / mL). A negative result must be combined with clinical  observations, patient history, and epidemiological information. If result is POSITIVE SARS-CoV-2 target nucleic acids are DETECTED. The SARS-CoV-2 RNA is generally detectable in upper and lower  respiratory specimens dur ing the acute phase of infection.  Positive  results are indicative of active infection with SARS-CoV-2.  Clinical  correlation with patient history and other diagnostic information is  necessary to determine patient infection status.  Positive results do  not rule out bacterial infection or co-infection with other viruses. If result is PRESUMPTIVE POSTIVE SARS-CoV-2 nucleic acids MAY BE PRESENT.   A presumptive positive result was obtained on the submitted specimen  and confirmed on repeat testing.  While 2019 novel coronavirus  (SARS-CoV-2) nucleic acids may be present in the submitted sample  additional confirmatory testing may be necessary for epidemiological  and / or clinical management purposes  to differentiate between  SARS-CoV-2 and other Sarbecovirus currently known to infect humans.  If clinically indicated additional testing with an alternate test  methodology [phone] is advised. The SARS-CoV-2 RNA is generally  detectable in upper and lower respiratory sp ecimens during the acute  phase of infection. The expected result is Negative. Fact Sheet for Patients:  BoilerBrush.com.cyhttps://www.fda.gov/media/136312/download Fact Sheet for Healthcare  Providers: https://pope.com/https://www.fda.gov/media/136313/download This test is not yet approved or cleared by  the [place] FDA and has been authorized for detection and/or diagnosis of SARS-CoV-2 by FDA under an Emergency Use Authorization (EUA).  This EUA will remain in effect (meaning this test can be used) for the duration of the COVID-19 declaration under Section 564(b)(1) of the Act, 21 U.S.C. section 360bbb-3(b)(1), unless the authorization is terminated or revoked sooner. Performed at [facility], [address] [place], [place] [address]   MRSA PCR Screening     Status: None   Collection Time: 02/21/2019  6:27 PM   Specimen: Nasopharyngeal  Result Value Ref Range Status   MRSA by PCR NEGATIVE NEGATIVE Final    Comment:        The GeneXpert MRSA Assay (FDA approved for NASAL specimens only), is one component of a comprehensive MRSA colonization surveillance program. It is not intended to diagnose MRSA infection nor to guide or monitor treatment for MRSA infections. Performed at [facility], [address] [place], [place] [address]     Coagulation Studies: No results for input(s): LABPROT, INR in the last 72 hours.  Urinalysis: No results for input(s): COLORURINE, LABSPEC, PHURINE, GLUCOSEU, HGBUR, BILIRUBINUR, KETONESUR, PROTEINUR, UROBILINOGEN, NITRITE, LEUKOCYTESUR in the last 72 hours.  Invalid input(s): APPERANCEUR    Imaging: [place] Abdomen Limited Ruq  Result Date: 02/22/2019 CLINICAL DATA:  Elevated liver function test. EXAM: ULTRASOUND ABDOMEN LIMITED RIGHT UPPER QUADRANT COMPARISON:  None. FINDINGS: Gallbladder: There is some sludge in the gallbladder. No pericholecystic fluid. Gallbladder wall is minimally thickened at 0.4 cm but the gallbladder is incompletely distended. Sonographer reports negative Murphy's sign. Common bile duct: Diameter: 0.3 cm Liver: The liver border appears somewhat lobulated and parenchymal echogenicity is increased  throughout. No focal lesion or intrahepatic biliary ductal dilatation. Portal vein is patent on color Doppler imaging with normal direction of blood flow towards the liver. Other: None. IMPRESSION: Small volume of gallbladder sludge without evidence of cholecystitis. Fatty infiltration of the liver. The liver border appears mildly lobulated suggestive of cirrhosis. Electronically Signed   By: Drusilla Kannerhomas  Dalessio M.D.   On: 02/22/2019 10:00     Medications:   . sodium chloride Stopped (02/22/19 1226)  . albumin human 25 g (02/23/19 1115)  . furosemide (LASIX) infusion 10 mg/hr (02/22/19 2323)  . [START ON 02/24/2019] thiamine injection     . enoxaparin (LOVENOX) injection  40 mg Subcutaneous Q24H  . feeding supplement  1 Container Oral TID BM  . folic acid  1 mg Oral Daily  . insulin aspart  0-5 Units Subcutaneous QHS  . insulin aspart  0-9 Units Subcutaneous TID WC  . mouth rinse  15 mL Mouth Rinse BID  . multivitamin with minerals  1 tablet Oral Daily  . pneumococcal 23 valent vaccine  0.5 mL Intramuscular Tomorrow-1000  . sodium chloride  1 g Oral BID WC  . zolpidem  10 mg Oral QHS   sodium chloride, hydrALAZINE, ondansetron **OR** ondansetron (ZOFRAN) IV  Assessment/ Plan:  63 y.o. male with a PMHx of diabetes mellitus type 2, hypertension, depression, hyperlipidemia, who was [facility] for evaluation of generalized weakness, shortness of breath, and signs of volume overload.  1.  Hyponatremia most likely due to volume overload. 2.  Pulmonary edema, improved.  3.  Hypokalemia. 4.  Hypertension.  Plan: Urine output yesterday was inaccurate as there was some urine leakage.  He has a condom catheter on now.  Serum sodium is up to 124.  Continue furosemide drip at 10 mg/h.  We will also continue sodium chloride 1 g twice daily for now.  No increasing shortness of breath noted at the moment.  Serum potassium up to 3.7.   LOS: 4 Matthew [place] 9/6/20201:44 PM

## 2019-02-23 NOTE — Progress Notes (Addendum)
2018 entered patient room for assessment. Patient with circumoral cyanosis. Sats 66% on 5L HFNC. Increased oxygen. Contacted Respiratory. Page into Night Call MD. Patient tachypneic with resp 32. Audible wheezing. Upon chest auscultation patient had insp and exp wheezing with some crackles. Unable to cough to clear congestion. Oxy turned up to 15L once respiratory arrived, sats in the 80's but slowly increasing. After about 15-20 mins sats improved to 93-95% on 15L HFNC and cyanosis resolved. Dr. Jannifer Franklin ordered chest xray and abg.   2120- notified Dr. Jannifer Franklin that xray and abg had been completed. Based on results he would like patient to go to step down for bipap. Patient notifed. Will call report as soon bed is available.

## 2019-02-23 NOTE — Progress Notes (Signed)
PHARMACY CONSULT NOTE - FOLLOW UP  Pharmacy Consult for Electrolyte Monitoring and Replacement   Recent Labs: Potassium (mmol/L)  Date Value  02/23/2019 3.7  04/16/2014 4.4   Magnesium (mg/dL)  Date Value  02/23/2019 1.7   Calcium (mg/dL)  Date Value  02/23/2019 8.8 (L)   Calcium, Total (mg/dL)  Date Value  04/16/2014 8.3 (L)   Albumin (g/dL)  Date Value  02/22/2019 3.7  01/16/2014 4.0   Phosphorus (mg/dL)  Date Value  02/23/2019 3.7   Sodium (mmol/L)  Date Value  02/23/2019 124 (L)  11/11/2014 139  04/16/2014 137     Assessment: 63 year old male with h/o significant alcohol use presented with hyponatremia most likely due to volume overload. Suspect heart failure may be contributing. 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  Goal of Therapy:  Electrolytes WNL  Plan:  Furosemide drip continues at 8 mg/hr. Continues to have good urine output, Scr trended up from 0.69>> 0.98.  Not starting hypertonic saline at this time. Na trending up slowly.   K 3.7, Mg: 1.7. Will order magnesium 2g IV x 1 dose. No additional replacement needed at this time.  Will recheck electrolytes with AM labs and continue to replace as needed.   Oswald Hillock, PharmD, BCPS Clinical Pharmacist 02/23/2019 8:40 AM

## 2019-02-23 NOTE — Progress Notes (Signed)
[place] [facility] NAME: Matthew Washington    MR#:  [id number]  DATE OF BIRTH:  10-07-1955  SUBJECTIVE:   Patient is still feeling very weak and "out of it" this morning.  He feels like his lower extremity edema has almost completely resolved.  He denies any shortness of breath or chest pain.  REVIEW OF SYSTEMS:  Review of Systems  Constitutional: Negative for chills and fever.  HENT: Negative for congestion and sore throat.   Eyes: Negative for blurred vision and double vision.  Respiratory: Negative for cough and shortness of breath.   Cardiovascular: Positive for leg swelling. Negative for chest pain and palpitations.  Gastrointestinal: Negative for nausea and vomiting.  Genitourinary: Negative for dysuria and urgency.  Musculoskeletal: Negative for back pain and neck pain.  Neurological: Negative for dizziness and headaches.  Psychiatric/Behavioral: Negative for depression. The patient is not nervous/anxious.     DRUG ALLERGIES:   Allergies  Allergen Reactions  . Shellfish Allergy Shortness Of Breath and Swelling  . Penicillins Hives  . Trazodone Other (See Comments)   VITALS:  Blood pressure 139/86, pulse 98, temperature 98.3 F (36.8 C), temperature source Oral, resp. rate 20, height 6\' 2"  (1.88 m), weight 91 kg, SpO2 94 %. PHYSICAL EXAMINATION:  Physical Exam  GENERAL:  Laying in the bed with no acute distress.  Sleepy but easily arousable. HEENT: Head atraumatic, normocephalic. Pupils equal, round, reactive to light and accommodation. No scleral icterus. Extraocular muscles intact. Oropharynx and nasopharynx clear. + Dry mucous membranes. NECK:  Supple, no jugular venous distention. No thyroid enlargement. LUNGS: + Bibasilar crackles present.  Nasal cannula in place. No use of accessory muscles of respiration.  CARDIOVASCULAR: RRR, S1, S2 normal. No murmurs, rubs, or gallops.  ABDOMEN: Soft, nontender, nondistended. Bowel  sounds present.  EXTREMITIES: No cyanosis, or clubbing. Trace bilateral lower extremity pitting edema, improved from previous exam. NEUROLOGIC: CN 2-12 intact, no focal deficits. +global weakness. Sensation intact throughout. Gait not checked.  PSYCHIATRIC: The patient is alert and oriented x 3.  SKIN: No obvious rash, lesion, or ulcer.  LABORATORY PANEL:  Male CBC Recent Labs  Lab 02/23/19 0743  WBC 10.4  HGB 13.4  HCT 37.8*  PLT 122*   ------------------------------------------------------------------------------------------------------------------ Chemistries  Recent Labs  Lab 02/22/19 0748  02/23/19 0743  NA 122*   < > 124*  K 4.6  --  3.7  CL 84*  --  86*  CO2 23  --  27  GLUCOSE 142*  --  121*  BUN 14  --  16  CREATININE 0.98  --  0.83  CALCIUM 8.8*  --  8.8*  MG 1.7  --  1.7  AST 98*  --   --   ALT 92*  --   --   ALKPHOS 146*  --   --   BILITOT 5.5*  --   --    < > = values in this interval not displayed.   RADIOLOGY:  No results found. ASSESSMENT AND PLAN:   Acute hypoxic respiratory failure secondary to acute systolic heart failure- lower extremity edema is improving. Still on 6L O2 this morning. UOP 649mL in 24 hours. -ECHO with EF 20-25% [facility] Cardiology consulted -Continue Lasix drip and albumin 25 g IV every 12 hours -Nephrology following  Hypervolemic hyponatremia- sodium continues to slowly improve -Continue treatment as above -Continue salt tabs today -No tolvaptan due to liver dysfunction  Elevated liver transaminases / hyperbilirubinemia -  due to alcoholism -RUQ US with cirrhosis and gallbladder sludge -Hepatitis panel pending  Hypomagnesemia- due to lasix gtt -Replete and recheck  Alcohol abuse -CIWA -MVI, folate, thiamine  Type 2 diabetes -SSI  Hypertension- BP well controlled -Holding home losartan  Hyperlipidemia-stable -Continue home simvastatin  DVT prophylaxis-Lovenox   All the records are reviewed and case discussed  with Care Management/Social Worker. Management plans discussed with the patient, family and they are in agreement.  CODE STATUS: Full Code  TOTAL TIME TAKING CARE OF THIS PATIENT: 40 minutes.   More than 50% of the time was spent in counseling/coordination of care: YES  POSSIBLE D/C IN 3-4 DAYS, DEPENDING ON CLINICAL CONDITION.   Matthew BlossomKaty D Omaira Washington M.D on 02/23/2019 at 1:09 PM  Between 7am to 6pm - Pager - [phone]  After 6pm go to [email or web address] - Social research officer, governmentpassword [email or web address]  [facility] [place] [facility]  Office  [phone]  CC: Primary care physician; Patient, No Pcp Per  Note: This dictation was prepared with Dragon dictation along with smaller phrase technology. Any transcriptional errors that result from this process are unintentional.

## 2019-02-23 NOTE — Progress Notes (Signed)
Dr. Stark Jock in to assess pt's rash with blisters on his left flank earlier this afternoon. Area appears ecchymotic and wraps around to back. Area is painful to touch but he denies pain unless it is touched. Received orders for IV acyclovir and pt placed placed on Airborne/Contact precautions for presumed shingles.  Explained to pt. Per pt he has history of shingles in same area in the past.

## 2019-02-23 NOTE — Consult Note (Signed)
Reason for Consult:Altered mental status congestive heart failure leg edema Referring Physician: Dr. Margaretmary Eddy hospitalist  Matthew Washington Matthew Washington. is an 63 y.o. male.  HPI: 64 year old male presents with weakness diarrhea diabetes hypertension hyperlipidemia depression brought to emergency room with generalized weakness unable to walk ambulate because generalized weakness.  Patient has significant lower extremity edema has had progressive diarrhea for about a week watery with no blood.  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 Allergy    seafood  . Broken arm 06/2014  . Diabetes mellitus without complication ([place])   . Hypertension   . MDD (major depressive disorder)     Past Surgical History:  Procedure Laterality Date  . APPENDECTOMY    . HUMERUS FRACTURE SURGERY Right 06/2014   6 screws and sleeve  . KNEE ARTHROPLASTY Right     Family History  Problem Relation Age of Onset  . Diabetes Mother   . Hypertension Mother   . Hypertension Father   . CVA Father     Social History:  reports that he has been smoking. He has been smoking about 1.00 pack per day. He has never used smokeless tobacco. He reports current alcohol use of about 21.0 standard drinks of alcohol per week. He reports that he does not use drugs.  Allergies:  Allergies  Allergen Reactions  .  Shellfish Allergy Shortness Of Breath and Swelling  . Penicillins Hives  . Trazodone Other (See Comments)    Medications: I have reviewed the patient's current medications.  Results for orders placed or performed during the hospital encounter of 03/07/2019 (from the past 48 hour(s))  Glucose, capillary     Status: Abnormal   Collection Time: 02/21/19  4:38 PM  Result Value Ref Range   Glucose-Capillary 194 (H) 70 - 99 mg/dL  Glucose, capillary     Status: Abnormal   Collection Time: 02/21/19 10:01 PM  Result Value Ref Range   Glucose-Capillary 190 (H) 70 - 99 mg/dL  Glucose, capillary     Status: Abnormal   Collection Time: 02/22/19  7:44 AM  Result Value Ref Range   Glucose-Capillary 137 (H) 70 - 99 mg/dL  Comprehensive metabolic panel     Status: Abnormal   Collection Time: 02/22/19  7:48 AM  Result Value Ref Range   Sodium 122 (L) 135 - 145 mmol/L   Potassium 4.6 3.5 - 5.1 mmol/L   Chloride 84 (L) 98 - 111 mmol/L   CO2 23 22 - 32 mmol/L   Glucose, Bld 142 (H) 70 - 99 mg/dL   BUN 14 8 - 23 mg/dL   Creatinine, Ser 0.98 0.61 - 1.24 mg/dL   Calcium 8.8 (L) 8.9 - 10.3 mg/dL   Total Protein 6.9 6.5 - 8.1 g/dL   Albumin 3.7 3.5 - 5.0 g/dL   AST 98 (H) 15 - 41 U/L   ALT 92 (H) 0 - 44 U/L   Alkaline Phosphatase 146 (H) 38 - 126 U/L  Total Bilirubin 5.5 (H) 0.3 - 1.2 mg/dL   GFR calc non Af Amer >60 >60 mL/min   GFR calc Af Amer >60 >60 mL/min   Anion gap 15 5 - 15    Comment: Performed at [facility], [address] [place], [place] [address]  CBC with Differential/Platelet     Status: Abnormal   Collection Time: 02/22/19  7:48 AM  Result Value Ref Range   WBC 9.5 4.0 - 10.5 K/uL   RBC 4.64 4.22 - 5.81 MIL/uL   Hemoglobin 14.8 13.0 - 17.0 g/dL   HCT 46.941.5 62.939.0 - 52.852.0 %   MCV 89.4 80.0 - 100.0 fL   MCH 31.9 26.0 - 34.0 pg   MCHC 35.7 30.0 - 36.0 g/dL   RDW 41.314.5 24.411.5 - 01.015.5 %   Platelets 132 (L) 150 - 400 K/uL   nRBC 0.0 0.0 - 0.2 %   Neutrophils Relative % 84  %   Neutro Abs 8.0 (H) 1.7 - 7.7 K/uL   Lymphocytes Relative 7 %   Lymphs Abs 0.7 0.7 - 4.0 K/uL   Monocytes Relative 7 %   Monocytes Absolute 0.7 0.1 - 1.0 K/uL   Eosinophils Relative 1 %   Eosinophils Absolute 0.1 0.0 - 0.5 K/uL   Basophils Relative 0 %   Basophils Absolute 0.0 0.0 - 0.1 K/uL   Immature Granulocytes 1 %   Abs Immature Granulocytes 0.05 0.00 - 0.07 K/uL    Comment: Performed at [facility], [address] [place], [place] [address]  Magnesium     Status: None   Collection Time: 02/22/19  7:48 AM  Result Value Ref Range   Magnesium 1.7 1.7 - 2.4 mg/dL    Comment: Performed at [facility], [address] [place], [place] [address]  Phosphorus     Status: Abnormal   Collection Time: 02/22/19  7:48 AM  Result Value Ref Range   Phosphorus 2.4 (L) 2.5 - 4.6 mg/dL    Comment: Performed at [facility], [address] [place], [place] [address]  Glucose, capillary     Status: Abnormal   Collection Time: 02/22/19 12:15 PM  Result Value Ref Range   Glucose-Capillary 207 (H) 70 - 99 mg/dL  Sodium     Status: Abnormal   Collection Time: 02/22/19  3:47 PM  Result Value Ref Range   Sodium 121 (L) 135 - 145 mmol/L    Comment: Performed at [facility], [address] [place], [place] [address]  Glucose, capillary     Status: Abnormal   Collection Time: 02/22/19  4:31 PM  Result Value Ref Range   Glucose-Capillary 139 (H) 70 - 99 mg/dL  Glucose, capillary     Status: Abnormal   Collection Time: 02/22/19  9:37 PM  Result Value Ref Range   Glucose-Capillary 225 (H) 70 - 99 mg/dL  Sodium     Status: Abnormal   Collection Time: 02/22/19 11:51 PM  Result Value Ref Range   Sodium 122 (L) 135 - 145 mmol/L    Comment: Performed at [facility], [address] [place], [place] [address]  Basic metabolic panel     Status: Abnormal   Collection Time: 02/23/19  7:43 AM  Result Value Ref Range   Sodium 124 (L) 135 - 145  mmol/L   Potassium 3.7 3.5 - 5.1 mmol/L   Chloride 86 (L) 98 - 111 mmol/L   CO2 27 22 - 32 mmol/L   Glucose,  Bld 121 (H) 70 - 99 mg/dL   BUN 16 8 - 23 mg/dL   Creatinine, Ser 3.36 0.61 - 1.24 mg/dL   Calcium 8.8 (L) 8.9 - 10.3 mg/dL   GFR calc non Af Amer >60 >60 mL/min   GFR calc Af Amer >60 >60 mL/min   Anion gap 11 5 - 15    Comment: Performed at [facility], [address] [place], [place] [address]  Magnesium     Status: None   Collection Time: 02/23/19  7:43 AM  Result Value Ref Range   Magnesium 1.7 1.7 - 2.4 mg/dL    Comment: Performed at [facility], [address] [place], [place] [address]  Phosphorus     Status: None   Collection Time: 02/23/19  7:43 AM  Result Value Ref Range   Phosphorus 3.7 2.5 - 4.6 mg/dL    Comment: Performed at [facility], [address] [place], [place] [address]  CBC     Status: Abnormal   Collection Time: 02/23/19  7:43 AM  Result Value Ref Range   WBC 10.4 4.0 - 10.5 K/uL   RBC 4.19 (L) 4.22 - 5.81 MIL/uL   Hemoglobin 13.4 13.0 - 17.0 g/dL   HCT 21.1 (L) 17.3 - 56.7 %   MCV 90.2 80.0 - 100.0 fL   MCH 32.0 26.0 - 34.0 pg   MCHC 35.4 30.0 - 36.0 g/dL   RDW 01.4 10.3 - 01.3 %   Platelets 122 (L) 150 - 400 K/uL    Comment: Immature Platelet Fraction may be clinically indicated, consider ordering this additional test [id number]    nRBC 0.0 0.0 - 0.2 %    Comment: Performed at [facility], [address] [place], [place] [address]  Glucose, capillary     Status: Abnormal   Collection Time: 02/23/19  8:05 AM  Result Value Ref Range   Glucose-Capillary 118 (H) 70 - 99 mg/dL  Glucose, capillary     Status: Abnormal   Collection Time: 02/23/19 11:15 AM  Result Value Ref Range   Glucose-Capillary 120 (H) 70 - 99 mg/dL    US Abdomen Limited Ruq  Result Date: 02/22/2019 CLINICAL DATA:  Elevated liver function test. EXAM: ULTRASOUND ABDOMEN LIMITED RIGHT UPPER QUADRANT COMPARISON:  None.  FINDINGS: Gallbladder: There is some sludge in the gallbladder. No pericholecystic fluid. Gallbladder wall is minimally thickened at 0.4 cm but the gallbladder is incompletely distended. Sonographer reports negative Murphy's sign. Common bile duct: Diameter: 0.3 cm Liver: The liver border appears somewhat lobulated and parenchymal echogenicity is increased throughout. No focal lesion or intrahepatic biliary ductal dilatation. Portal vein is patent on color Doppler imaging with normal direction of blood flow towards the liver. Other: None. IMPRESSION: Small volume of gallbladder sludge without evidence of cholecystitis. Fatty infiltration of the liver. The liver border appears mildly lobulated suggestive of cirrhosis. Electronically Signed   By: Drusilla Kanner M.D.   On: 02/22/2019 10:00    Review of Systems  Unable to perform ROS: Mental status change   Blood pressure 139/86, pulse 98, temperature 98.3 F (36.8 C), temperature source Oral, resp. rate 20, height 6\' 2"  (1.88 m), weight 91 kg, SpO2 94 %. Physical Exam  Nursing note and vitals reviewed. Constitutional: He appears well-developed and well-nourished.  HENT:  Head: Normocephalic and atraumatic.  Eyes: Pupils are equal, round, and reactive to light. Conjunctivae and EOM are normal.  Neck: Normal range of motion. Neck supple.  Cardiovascular: Normal rate,  regular rhythm and normal heart sounds.  Respiratory: Effort normal and breath sounds normal.  GI: Soft. Bowel sounds are normal.  Musculoskeletal: Normal range of motion.  Neurological: He has normal strength. He is unresponsive.    Assessment/Plan: Cardiomyopathy Congestive heart failure Altered mental status Diabetes Hypertension Hyperlipidemia Depression Alcohol abuse Hyponatremia Smoking Edema . 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

## 2019-02-24 DIAGNOSIS — J9601 Acute respiratory failure with hypoxia: Secondary | ICD-10-CM

## 2019-02-24 DIAGNOSIS — J9600 Acute respiratory failure, unspecified whether with hypoxia or hypercapnia: Secondary | ICD-10-CM

## 2019-02-24 LAB — COMPREHENSIVE METABOLIC PANEL
ALT: 57 U/L — ABNORMAL HIGH (ref 0–44)
AST: 55 U/L — ABNORMAL HIGH (ref 15–41)
Albumin: 3.7 g/dL (ref 3.5–5.0)
Alkaline Phosphatase: 156 U/L — ABNORMAL HIGH (ref 38–126)
Anion gap: 11 (ref 5–15)
BUN: 17 mg/dL (ref 8–23)
CO2: 28 mmol/L (ref 22–32)
Calcium: 8.4 mg/dL — ABNORMAL LOW (ref 8.9–10.3)
Chloride: 87 mmol/L — ABNORMAL LOW (ref 98–111)
Creatinine, Ser: 0.75 mg/dL (ref 0.61–1.24)
GFR calc Af Amer: >60 mL/min (ref >60)
GFR calc non Af Amer: >60 mL/min (ref >60)
Glucose, Bld: 110 mg/dL — ABNORMAL HIGH (ref 70–99)
Potassium: 3.1 mmol/L — ABNORMAL LOW (ref 3.5–5.1)
Sodium: 126 mmol/L — ABNORMAL LOW (ref 135–145)
Total Bilirubin: 6.1 mg/dL — ABNORMAL HIGH (ref 0.3–1.2)
Total Protein: 6.7 g/dL (ref 6.5–8.1)

## 2019-02-24 LAB — CBC
HCT: 35.8 % — ABNORMAL LOW (ref 39.0–52.0)
Hemoglobin: 12.8 g/dL — ABNORMAL LOW (ref 13.0–17.0)
MCH: 32.5 pg (ref 26.0–34.0)
MCHC: 35.8 g/dL (ref 30.0–36.0)
MCV: 90.9 fL (ref 80.0–100.0)
Platelets: 116 K/uL — ABNORMAL LOW (ref 150–400)
RBC: 3.94 MIL/uL — ABNORMAL LOW (ref 4.22–5.81)
RDW: 14.6 % (ref 11.5–15.5)
WBC: 10.4 K/uL (ref 4.0–10.5)
nRBC: 0 % (ref 0.0–0.2)

## 2019-02-24 LAB — GLUCOSE, CAPILLARY
Glucose-Capillary: 140 mg/dL — ABNORMAL HIGH (ref 70–99)
Glucose-Capillary: 147 mg/dL — ABNORMAL HIGH (ref 70–99)
Glucose-Capillary: 205 mg/dL — ABNORMAL HIGH (ref 70–99)

## 2019-02-24 LAB — MAGNESIUM: Magnesium: 1.7 mg/dL (ref 1.7–2.4)

## 2019-02-24 LAB — SODIUM
Sodium: 124 mmol/L — ABNORMAL LOW (ref 135–145)
Sodium: 125 mmol/L — ABNORMAL LOW (ref 135–145)
Sodium: 125 mmol/L — ABNORMAL LOW (ref 135–145)

## 2019-02-24 LAB — PROCALCITONIN: Procalcitonin: <0.1 ng/mL

## 2019-02-24 MED ORDER — MORPHINE SULFATE (PF) 2 MG/ML IV SOLN
1.0000 mg | INTRAVENOUS | Status: DC | PRN
  Administered 2019-02-24: 2 mg via INTRAVENOUS
  Filled 2019-02-24: qty 1

## 2019-02-24 MED ORDER — SODIUM CHLORIDE 0.9 % IV SOLN
1.0000 g | Freq: Three times a day (TID) | INTRAVENOUS | Status: DC
  Administered 2019-02-24 – 2019-02-25 (×3): 1 g via INTRAVENOUS
  Filled 2019-02-24 (×5): qty 1

## 2019-02-24 MED ORDER — FLUOXETINE HCL 20 MG PO CAPS
40.0000 mg | ORAL_CAPSULE | Freq: Every day | ORAL | Status: DC
  Administered 2019-02-24 – 2019-02-26 (×3): 40 mg via ORAL
  Filled 2019-02-24 (×3): qty 2

## 2019-02-24 MED ORDER — BUDESONIDE 0.5 MG/2ML IN SUSP
0.5000 mg | Freq: Two times a day (BID) | RESPIRATORY_TRACT | Status: DC
  Administered 2019-02-24 – 2019-02-26 (×4): 0.5 mg via RESPIRATORY_TRACT
  Filled 2019-02-24 (×5): qty 2

## 2019-02-24 MED ORDER — ACETAMINOPHEN 650 MG RE SUPP: 650.0000 mg | Freq: Four times a day (QID) | RECTAL | Status: DC | PRN

## 2019-02-24 MED ORDER — IPRATROPIUM-ALBUTEROL 0.5-2.5 (3) MG/3ML IN SOLN
3.0000 mL | RESPIRATORY_TRACT | Status: DC
  Administered 2019-02-24 – 2019-02-26 (×12): 3 mL via RESPIRATORY_TRACT
  Filled 2019-02-24 (×14): qty 3

## 2019-02-24 MED ORDER — MAGNESIUM SULFATE 2 GM/50ML IV SOLN
2.0000 g | Freq: Once | INTRAVENOUS | Status: AC
  Administered 2019-02-24: 2 g via INTRAVENOUS
  Filled 2019-02-24: qty 50

## 2019-02-24 MED ORDER — ZOLPIDEM TARTRATE 5 MG PO TABS
5.0000 mg | ORAL_TABLET | Freq: Once | ORAL | Status: AC
  Administered 2019-02-24: 5 mg via ORAL
  Filled 2019-02-24: qty 1

## 2019-02-24 MED ORDER — METHYLPREDNISOLONE SODIUM SUCC 40 MG IJ SOLR
40.0000 mg | Freq: Two times a day (BID) | INTRAMUSCULAR | Status: DC
  Administered 2019-02-24 – 2019-02-26 (×5): 40 mg via INTRAVENOUS
  Filled 2019-02-24 (×5): qty 1

## 2019-02-24 MED ORDER — ACYCLOVIR 5 % EX OINT
TOPICAL_OINTMENT | Freq: Three times a day (TID) | CUTANEOUS | Status: DC
  Administered 2019-02-24 – 2019-02-26 (×7): via TOPICAL
  Filled 2019-02-24: qty 15

## 2019-02-24 MED ORDER — ACETAMINOPHEN 325 MG PO TABS
650.0000 mg | ORAL_TABLET | Freq: Four times a day (QID) | ORAL | Status: DC | PRN
  Administered 2019-02-24: 650 mg via ORAL
  Filled 2019-02-24: qty 2

## 2019-02-24 MED ORDER — POTASSIUM CHLORIDE 10 MEQ/100ML IV SOLN
10.0000 meq | INTRAVENOUS | Status: AC
  Administered 2019-02-24 (×4): 10 meq via INTRAVENOUS
  Filled 2019-02-24 (×4): qty 100

## 2019-02-24 NOTE — Progress Notes (Signed)
[facility]  ROUNDING NOTE   Subjective:  Patient with worsening respiratory status. Transferred back to the critical care unit and placed on BiPAP. Bilateral infiltrates noted. Suspect that this is more likely pneumonia rather than pulmonary edema as he has had good diuresis since admission.    Objective:  Vital signs in last 24 hours:  Temp:  [97.5 F (36.4 C)-99.1 F (37.3 C)] 99.1 F (37.3 C) (09/06 2340) Pulse Rate:  [73-112] 80 (09/07 1000) Resp:  [16-36] 16 (09/07 0800) BP: (106-135)/(62-95) 129/77 (09/07 1000) SpO2:  [66 %-100 %] 89 % (09/07 1000) Weight:  [91.4 kg] 91.4 kg (09/06 2250)  Weight change: 0.363 kg Filed Weights   02/22/19 0317 02/23/19 0337 02/23/19 2250  Weight: 93.3 kg 91 kg 91.4 kg    Intake/Output: I/O last 3 completed shifts: In: 399 [I.V.:205.5; IV Piggyback:193.5] Out: 775 [Urine:775]   Intake/Output this shift:  Total I/O In: 536.7 [I.V.:50.7; IV Piggyback:486] Out: -   Physical Exam: General: Critically ill-appearing  Head: Normocephalic, atraumatic. Moist oral mucosal membranes  Eyes: Mild icterus noted  Neck: Supple, trachea midline  Lungs:  Bilateral rales, increased work of breathing  Heart: S1S2 no rubs  Abdomen:  Soft, nontender, bowel sounds present  Extremities: 2+ peripheral edema.  Neurologic: Arousable but not following commands  Skin: No lesions       Basic Metabolic Panel: Recent Labs  Lab 02/20/19 0605  02/21/19 0329  02/21/19 1421 02/22/19 0748  02/23/19 0743 02/23/19 1616 02/23/19 2345 02/24/19 0517 02/24/19 0826  NA 117*   < > 119*   < >  --  122*   < > 124* 123* 124* 126* 125*  K 3.3*   < > 3.2*  --  3.7 4.6  --  3.7  --   --  3.1*  --   CL 82*  --  81*  --   --  84*  --  86*  --   --  87*  --   CO2 21*  --  24  --   --  23  --  27  --   --  28  --   GLUCOSE 111*  --  122*  --   --  142*  --  121*  --   --  110*  --   BUN 14  --  11  --   --  14  --  16  --   --  17  --   CREATININE  0.72  --  0.69  --   --  0.98  --  0.83  --   --  0.75  --   CALCIUM 8.4*  --  8.3*  --   --  8.8*  --  8.8*  --   --  8.4*  --   MG 1.4*  --  1.5*  --  2.0 1.7  --  1.7  --   --  1.7  --   PHOS 3.3  --  3.3  --   --  2.4*  --  3.7  --   --   --   --    < > = values in this interval not displayed.    Liver Function Tests: Recent Labs  Lab 17-Aug-2018 1429 02/20/19 0605 02/21/19 0329 02/22/19 0748 02/24/19 0517  AST 257* 155* 117* 98* 55*  ALT 189* 136* 107* 92* 57*  ALKPHOS 152* 133* 164* 146* 156*  BILITOT 5.0* 4.5* 5.1* 5.5* 6.1*  PROT  7.5 6.6 6.8 6.9 6.7  ALBUMIN 3.4* 3.0* 3.6 3.7 3.7   No results for input(s): LIPASE, AMYLASE in the last 168 hours. No results for input(s): AMMONIA in the last 168 hours.  CBC: Recent Labs  Lab Mar 12, 2019 1429 02/20/19 0605 02/21/19 0236 02/22/19 0748 02/23/19 0743 02/24/19 0517  WBC 8.0 7.7 6.5 9.5 10.4 10.4  NEUTROABS 6.7 6.7 5.5 8.0*  --   --   HGB 15.2 14.3 13.4 14.8 13.4 12.8*  HCT 41.0 38.4* 36.5* 41.5 37.8* 35.8*  MCV 88.7 88.1 88.4 89.4 90.2 90.9  PLT 155 150 138* 132* 122* 116*    Cardiac Enzymes: No results for input(s): CKTOTAL, CKMB, CKMBINDEX, TROPONINI in the last 168 hours.  BNP: Invalid input(s): POCBNP  CBG: Recent Labs  Lab 02/23/19 0805 02/23/19 1115 02/23/19 1637 02/23/19 2112 02/23/19 2240  GLUCAP 118* 120* 193* 147* 128*    Microbiology: Results for orders placed or performed during the hospital encounter of 03-12-2019  SARS Coronavirus 2 [facility] order, Performed in [facility] hospital lab) Nasopharyngeal Nasopharyngeal Swab     Status: None   Collection Time: March 12, 2019  2:33 PM   Specimen: Nasopharyngeal Swab  Result Value Ref Range Status   SARS Coronavirus 2 NEGATIVE NEGATIVE Final    Comment: (NOTE) If result is NEGATIVE SARS-CoV-2 target nucleic acids are NOT DETECTED. The SARS-CoV-2 RNA is generally detectable in upper and lower  respiratory specimens during the acute phase of infection.  The lowest  concentration of SARS-CoV-2 viral copies this assay can detect is 250  copies / mL. A negative result does not preclude SARS-CoV-2 infection  and should not be used as the sole basis for treatment or other  patient management decisions.  A negative result may occur with  improper specimen collection / handling, submission of specimen other  than nasopharyngeal swab, presence of viral mutation(s) within the  areas targeted by this assay, and inadequate number of viral copies  (<250 copies / mL). A negative result must be combined with clinical  observations, patient history, and epidemiological information. If result is POSITIVE SARS-CoV-2 target nucleic acids are DETECTED. The SARS-CoV-2 RNA is generally detectable in upper and lower  respiratory specimens dur ing the acute phase of infection.  Positive  results are indicative of active infection with SARS-CoV-2.  Clinical  correlation with patient history and other diagnostic information is  necessary to determine patient infection status.  Positive results do  not rule out bacterial infection or co-infection with other viruses. If result is PRESUMPTIVE POSTIVE SARS-CoV-2 nucleic acids MAY BE PRESENT.   A presumptive positive result was obtained on the submitted specimen  and confirmed on repeat testing.  While 2019 novel coronavirus  (SARS-CoV-2) nucleic acids may be present in the submitted sample  additional confirmatory testing may be necessary for epidemiological  and / or clinical management purposes  to differentiate between  SARS-CoV-2 and other Sarbecovirus currently known to infect humans.  If clinically indicated additional testing with an alternate test  methodology [phone]) is advised. The SARS-CoV-2 RNA is generally  detectable in upper and lower respiratory sp ecimens during the acute  phase of infection. The expected result is Negative. Fact Sheet for Patients:   BoilerBrush.com.cy Fact Sheet for Healthcare Providers: https://pope.com/ This test is not yet approved or cleared by the [place] FDA and has been authorized for detection and/or diagnosis of SARS-CoV-2 by FDA under an Emergency Use Authorization (EUA).  This EUA will remain in effect (meaning this test can  be used) for the duration of the COVID-19 declaration under Section 564(b)(1) of the Act, 21 U.S.C. section 360bbb-3(b)(1), unless the authorization is terminated or revoked sooner. Performed at [facility], [address] [place], [place] [address]   MRSA PCR Screening     Status: None   Collection Time: August 11, 2018  6:27 PM   Specimen: Nasopharyngeal  Result Value Ref Range Status   MRSA by PCR NEGATIVE NEGATIVE Final    Comment:        The GeneXpert MRSA Assay (FDA approved for NASAL specimens only), is one component of a comprehensive MRSA colonization surveillance program. It is not intended to diagnose MRSA infection nor to guide or monitor treatment for MRSA infections. Performed at [facility], [address] [place], [place] [address]     Coagulation Studies: No results for input(s): LABPROT, INR in the last 72 hours.  Urinalysis: No results for input(s): COLORURINE, LABSPEC, PHURINE, GLUCOSEU, HGBUR, BILIRUBINUR, KETONESUR, PROTEINUR, UROBILINOGEN, NITRITE, LEUKOCYTESUR in the last 72 hours.  Invalid input(s): APPERANCEUR    Imaging: Dg Chest Port 1 View  Result Date: 02/23/2019 CLINICAL DATA:  Hypoxia EXAM: PORTABLE CHEST 1 VIEW COMPARISON:  02/20/2019 FINDINGS: Severe diffuse airspace disease throughout the lungs, similar to prior study. Cardiomegaly. No effusions or pneumothorax. No acute bony abnormality. IMPRESSION: Severe diffuse bilateral airspace disease, unchanged. Electronically Signed   By: Charlett NoseKevin  Dover M.D.   On: 02/23/2019 21:06     Medications:   . sodium chloride 100  mL (02/24/19 0613)  . acyclovir 910 mg (02/24/19 09810614)  . albumin human 25 g (02/24/19 1045)  . furosemide (LASIX) infusion 10 mg/hr (02/23/19 1944)  . magnesium sulfate bolus IVPB 2 g (02/24/19 1046)  . meropenem (MERREM) IV    . potassium chloride 10 mEq (02/24/19 1037)  . thiamine injection     . acyclovir ointment   Topical Q8H  . budesonide (PULMICORT) nebulizer solution  0.5 mg Nebulization BID  . carvedilol  3.125 mg Oral BID WC  . Chlorhexidine Gluconate Cloth  6 each Topical Daily  . enoxaparin (LOVENOX) injection  40 mg Subcutaneous Q24H  . feeding supplement  1 Container Oral TID BM  . folic acid  1 mg Oral Daily  . insulin aspart  0-5 Units Subcutaneous QHS  . insulin aspart  0-9 Units Subcutaneous TID WC  . ipratropium-albuterol  3 mL Nebulization Q4H  . mouth rinse  15 mL Mouth Rinse BID  . methylPREDNISolone (SOLU-MEDROL) injection  40 mg Intravenous Q12H  . multivitamin with minerals  1 tablet Oral Daily  . pneumococcal 23 valent vaccine  0.5 mL Intramuscular Tomorrow-1000  . sacubitril-valsartan  1 tablet Oral BID  . zolpidem  5 mg Oral QHS   sodium chloride, acetaminophen **OR** acetaminophen, hydrALAZINE, morphine injection, ondansetron **OR** ondansetron (ZOFRAN) IV  Assessment/ Plan:  63 y.o. male with a PMHx of diabetes mellitus type 2, hypertension, depression, hyperlipidemia, who was [facility] on  for evaluation of generalized weakness, shortness of breath, and signs of volume overload.  1.  Hyponatremia most likely due to volume overload. 2.  Pulmonary edema, improved.  3.  Hypokalemia. 4.  Hypertension. 5.  Acute respiratory failure, now on bipap.   Plan: We were originally asked to see the patient for severe hyponatremia.  This is very slowly corrected with Lasix drip over the course of the week.  Serum sodium currently 125.  He has been maintained on furosemide 10 mg IV per hour over the past  several days.  In addition we had added  salt tablets however his respiratory status is now worsened and this has been discontinued.  Suspect that this is more likely airspace disease in the form of pneumonia rather than pulmonary edema which was noted earlier in the admission.  Case discussed with pulmonary/critical care.  Antibiotics added.  Overall prognosis guarded given very low ejection fraction.   LOS: 5 Matthew Washington 9/7/202011:18 AM

## 2019-02-24 NOTE — Progress Notes (Signed)
Patient tolerating Heated HFNC well at this time. Patient requesting to stay on HFNC instead on Bipap. Bipap on stand by in patient room. Will continue to monitor.

## 2019-02-24 NOTE — Progress Notes (Signed)
[place] [facility] [place] NAME: Matthew Washington    MR#:  [id number]  DATE OF BIRTH:  Jul 26, 1955  SUBJECTIVE:   Patient noted to be cyanotic and hypoxic on 5 L HFNC yesterday.  He required BiPAP and was transferred to the ICU.  He is little confused this morning.  REVIEW OF SYSTEMS:  Unable to obtain due to patient being somnolent.  DRUG ALLERGIES:   Allergies  Allergen Reactions  . Shellfish Allergy Shortness Of Breath and Swelling  . Penicillins Hives  . Trazodone Other (See Comments)   VITALS:  Blood pressure 129/77, pulse 80, temperature 99.1 F (37.3 C), temperature source Axillary, resp. rate 16, height 6\' 3"  (1.905 m), weight 91.4 kg, SpO2 93 %. PHYSICAL EXAMINATION:  Physical Exam  GENERAL:  Laying in the bed with no acute distress.  Sleepy but easily arousable. HEENT: Head atraumatic, normocephalic. Pupils equal, round, reactive to light and accommodation. No scleral icterus. Extraocular muscles intact. Oropharynx and nasopharynx clear.  NECK:  Supple, no jugular venous distention. No thyroid enlargement. LUNGS: + diffuse bilateral crackles present.  BiPAP in place.  CARDIOVASCULAR: RRR, S1, S2 normal. No murmurs, rubs, or gallops.  ABDOMEN: Soft, nontender, nondistended. Bowel sounds present.  EXTREMITIES: No cyanosis, or clubbing. 1+ bilateral lower extremity pitting edema. NEUROLOGIC: CN 2-12 intact, no focal deficits. +global weakness. Sensation intact throughout. Gait not checked.  PSYCHIATRIC: The patient is alert and oriented x 3.  SKIN: No obvious rash, lesion, or ulcer.  LABORATORY PANEL:  Male CBC Recent Labs  Lab 02/24/19 0517  WBC 10.4  HGB 12.8*  HCT 35.8*  PLT 116*   ------------------------------------------------------------------------------------------------------------------ Chemistries  Recent Labs  Lab 02/24/19 0517 02/24/19 0826  NA 126* 125*  K 3.1*  --   CL 87*  --   CO2 28  --   GLUCOSE  110*  --   BUN 17  --   CREATININE 0.75  --   CALCIUM 8.4*  --   MG 1.7  --   AST 55*  --   ALT 57*  --   ALKPHOS 156*  --   BILITOT 6.1*  --    RADIOLOGY:  Dg Chest Port 1 View  Result Date: 02/23/2019 CLINICAL DATA:  Hypoxia EXAM: PORTABLE CHEST 1 VIEW COMPARISON:  02/20/2019 FINDINGS: Severe diffuse airspace disease throughout the lungs, similar to prior study. Cardiomegaly. No effusions or pneumothorax. No acute bony abnormality. IMPRESSION: Severe diffuse bilateral airspace disease, unchanged. Electronically Signed   By: Rolm Baptise M.D.   On: 02/23/2019 21:06   ASSESSMENT AND PLAN:   Acute hypoxic respiratory failure secondary to acute systolic heart failure and possible HCAP- lower extremity edema is improving.  -ECHO with EF 20-25% [facility] Cardiology consulted- started on entresto and coreg -Continue Lasix drip and albumin 25 g IV every 12 hours -Started on broad spectrum abx for possible HCAP -Nephrology following -Transition back to [place] as able  Hypervolemic hyponatremia- sodium continues to slowly improve -Continue treatment as above -Salt tabs discontinued  -No tolvaptan due to liver dysfunction  Elevated liver transaminases / hyperbilirubinemia - due to alcoholism -RUQ [place] with cirrhosis and gallbladder sludge -Hepatitis panel pending  Hypokalemia / hypomagnesemia- due to lasix gtt -Replete and recheck  Alcohol abuse -CIWA -MVI, folate, thiamine  Type 2 diabetes -SSI  Hypertension- BP well controlled -Started on entresto and coreg  Hyperlipidemia- stable -Continue home simvastatin  DVT prophylaxis-Lovenox   All the records are reviewed and case discussed  with Care Management/Social Worker. Management plans discussed with the patient, family and they are in agreement.  CODE STATUS: Full Code  TOTAL TIME TAKING CARE OF THIS PATIENT: 40 minutes.   More than 50% of the time was spent in counseling/coordination of care: YES  POSSIBLE D/C unknown,  DEPENDING ON CLINICAL CONDITION.   Jinny BlossomKaty D Mayo M.D on 02/24/2019 at 2:05 PM  Between 7am to 6pm - Pager - [phone]  After 6pm go to [email or web address] - Social research officer, governmentpassword EPAS [facility]  [facility]  Office  [phone]  CC: Primary care physician; Patient, No Pcp Per  Note: This dictation was prepared with Dragon dictation along with smaller phrase technology. Any transcriptional errors that result from this process are unintentional.

## 2019-02-24 NOTE — Progress Notes (Signed)
PHARMACY CONSULT NOTE - FOLLOW UP  Pharmacy Consult for Electrolyte Monitoring and Replacement   Recent Labs: Potassium (mmol/L)  Date Value  02/24/2019 3.1 (L)  04/16/2014 4.4   Magnesium (mg/dL)  Date Value  02/24/2019 1.7   Calcium (mg/dL)  Date Value  02/24/2019 8.4 (L)   Calcium, Total (mg/dL)  Date Value  04/16/2014 8.3 (L)   Albumin (g/dL)  Date Value  02/24/2019 3.7  01/16/2014 4.0   Phosphorus (mg/dL)  Date Value  02/23/2019 3.7   Sodium (mmol/L)  Date Value  02/24/2019 126 (L)  11/11/2014 139  04/16/2014 137     Assessment: 63 year old male with h/o significant alcohol use presented with hyponatremia most likely due to volume overload. Suspect heart failure may be contributing. 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  Na   Goal of Therapy:  Electrolytes WNL  Plan:  Furosemide drip at 10 mg/hr.   Continues to have good urine output, Scr trended up from 0.69>> 0.98, now 0.75.  Not starting hypertonic saline at this time.   Na trending up slowly (now 126).   K 3.1, Mg: 1.7.   Will order magnesium 2g IV x 1 dose.   Will order KCL IV 18meq x 4  No additional replacement needed at this time.  Will recheck electrolytes with AM labs and continue to replace as needed.   Lu Duffel, PharmD, BCPS Clinical Pharmacist 02/24/2019 7:09 AM

## 2019-02-24 NOTE — Consult Note (Signed)
PULMONARY / CRITICAL CARE MEDICINE  Name: Matthew MiresDavid Harold Schriever Jr. MRN: [id number] DOB: 05-02-1956    LOS: 5  Referring Provider: Dr. Anne HahnWillis Reason for Referral: Acute hypoxic respiratory failure Brief patient description: This is a 63 year old male admitted with new onset CHF, hyponatremia and alcohol abuse; was transferred out of the ICU after stabilization now will bounce back with hypoxic respiratory failure requiring BiPAP  HPI: This is a 63 year old male with a medical history as indicated below who was initially admitted on 03/12/2019 with new onset CHF, lower extremity edema, hyponatremia secondary to diarrhea and alcohol abuse.  He was placed on a Lasix infusion and also given hypertonic saline for his hyponatremia.  He was stabilized and transferred out of the ICU.  Last night patient became acutely hypoxic and cyanotic and was barely responsive.  A rapid response was called and he was placed on BiPAP and transferred to the ICU for further management.  SPO2 is much improved with BiPAP.  His chest x-ray showed severe diffuse pulmonary edema and his ABG on high flow nasal cannula  showed moderate hypoxemia.  He is still on a Lasix infusion at 10 mg/h. He has been followed by cardiology.  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  Patient also developed a rash on his left flank on 02/23/2019 that was deemed to be shingles.  He was started on acyclovir and placed on airborne and contact precautions.  Past Medical History:  Diagnosis Date  . Allergy    seafood  . Broken arm 06/2014  . Diabetes mellitus without complication (HCC)   . Hypertension   . MDD (major depressive disorder)    Past Surgical History:  Procedure Laterality Date  . APPENDECTOMY    . HUMERUS FRACTURE SURGERY Right 06/2014   6 screws and sleeve  . KNEE ARTHROPLASTY Right    No current facility-administered medications on file prior to encounter.    Current Outpatient  Medications on File Prior to Encounter  Medication Sig  . FLUoxetine (PROZAC) 40 MG capsule Take by mouth.  . zolpidem (AMBIEN) 10 MG tablet Take 10 mg by mouth at bedtime.  Marland Kitchen. aspirin 325 MG tablet Take 325 mg by mouth daily.  Marland Kitchen. lisinopril (ZESTRIL) 5 MG tablet Take 5 mg by mouth daily.  . metFORMIN (GLUCOPHAGE) 500 MG tablet Take 500 mg by mouth 2 (two) times daily.  . nortriptyline (PAMELOR) 25 MG capsule Take by mouth.  . simvastatin (ZOCOR) 20 MG tablet Take 20 mg by mouth Nightly.    Allergies Allergies  Allergen Reactions  . Shellfish Allergy Shortness Of Breath and Swelling  . Penicillins Hives  . Trazodone Other (See Comments)    Family History Family History  Problem Relation Age of Onset  . Diabetes Mother   . Hypertension Mother   . Hypertension Father   . CVA Father    Social History  reports that he has been smoking. He has been smoking about 1.00 pack per day. He has never used smokeless tobacco. He reports current alcohol use of about 21.0 standard drinks of alcohol per week. He reports that he does not use drugs.  Review Of Systems: Unable to obtain as patient is on continuous BiPAP  VITAL SIGNS: BP 114/77   Pulse 96   Temp 99.1 F (37.3 C) (Axillary)   Resp (!) 34   Ht 6\' 3"  (1.905 m)   Wt 91.4 kg   SpO2 100%   BMI 25.19 kg/m   HEMODYNAMICS:  VENTILATOR SETTINGS:    INTAKE / OUTPUT: I/O last 3 completed shifts: In: 922.5 [I.V.:357.2; IV Piggyback:565.4] Out: 600 [Urine:600]  PHYSICAL EXAMINATION: General: Moderate respiratory distress HEENT: PERRLA, trachea midline, moderate JVD Neuro: Alert and oriented x2, moves all extremities, diminished strength in lower extremities Cardiovascular: Apical pulse regular, S1-S2, no murmur regurg or gallop, +2 pulses bilaterally Lungs: Bilateral breath sounds diminished in all lung fields with diffuse crackles in all lung fields Abdomen: Nondistended, normal bowel sounds in all 4 quadrants palpation  reveals no organomegaly Musculoskeletal: Positive range of motion, no joint deformities Skin: Left flank blisters intact with severe pain on gentle palpation  LABS:  BMET Recent Labs  Lab 02/21/19 0329  02/21/19 1421 02/22/19 0748  02/23/19 0743 02/23/19 1616 02/23/19 2345  NA 119*   < >  --  122*   < > 124* 123* 124*  K 3.2*  --  3.7 4.6  --  3.7  --   --   CL 81*  --   --  84*  --  86*  --   --   CO2 24  --   --  23  --  27  --   --   BUN 11  --   --  14  --  16  --   --   CREATININE 0.69  --   --  0.98  --  0.83  --   --   GLUCOSE 122*  --   --  142*  --  121*  --   --    < > = values in this interval not displayed.    Electrolytes Recent Labs  Lab 02/21/19 0329 02/21/19 1421 02/22/19 0748 02/23/19 0743  CALCIUM 8.3*  --  8.8* 8.8*  MG 1.5* 2.0 1.7 1.7  PHOS 3.3  --  2.4* 3.7    CBC Recent Labs  Lab 02/21/19 0236 02/22/19 0748 02/23/19 0743  WBC 6.5 9.5 10.4  HGB 13.4 14.8 13.4  HCT 36.5* 41.5 37.8*  PLT 138* 132* 122*    Coag's Recent Labs  Lab 2019/02/28 1429  INR 2.0*    Sepsis Markers No results for input(s): LATICACIDVEN, PROCALCITON, O2SATVEN in the last 168 hours.  ABG Recent Labs  Lab 02/23/19 2052  PHART 7.52*  PCO2ART 34  PO2ART 80*    Liver Enzymes Recent Labs  Lab 02/20/19 0605 02/21/19 0329 02/22/19 0748  AST 155* 117* 98*  ALT 136* 107* 92*  ALKPHOS 133* 164* 146*  BILITOT 4.5* 5.1* 5.5*  ALBUMIN 3.0* 3.6 3.7    Cardiac Enzymes No results for input(s): TROPONINI, PROBNP in the last 168 hours.  Glucose Recent Labs  Lab 02/22/19 2137 02/23/19 0805 02/23/19 1115 02/23/19 1637 02/23/19 2112 02/23/19 2240  GLUCAP 225* 118* 120* 193* 147* 128*    Imaging Dg Chest Port 1 View  Result Date: 02/23/2019 CLINICAL DATA:  Hypoxia EXAM: PORTABLE CHEST 1 VIEW COMPARISON:  02/20/2019 FINDINGS: Severe diffuse airspace disease throughout the lungs, similar to prior study. Cardiomegaly. No effusions or pneumothorax. No  acute bony abnormality. IMPRESSION: Severe diffuse bilateral airspace disease, unchanged. Electronically Signed   By: Rolm Baptise M.D.   On: 02/23/2019 21:06     STUDIES:  None  CULTURES: None  ANTIBIOTICS: None  SIGNIFICANT EVENTS: 28-Feb-2019 admitted 02/24/2019: Bounced back to the ICU  LINES/TUBES: Peripheral IV  DISCUSSION: 63 year old male with new onset CHF on a Lasix drip, alcoholic cardiomyopathy and hyponatremia due to volume overload now presenting with acute hypoxic  respiratory failure secondary to acute pulmonary edema  ASSESSMENT  Acute hypoxic respiratory failure requiring BiPAP New onset CHF Lower extremity edema Hyponatremia Alcoholic cardiomyopathy Type 2 diabetes Hypertension New onset shingles  PLAN Continuous BiPAP and titrate to off as tolerated Continue IV Lasix per cardiology Monitor and correct electrolytes Blood glucose monitoring with sliding scale insulin coverage Trend procalcitonin to rule out underlying infectious process Cardiology and nephrology following Contact and airborne precautions for shingles and continue a Cyclovir IV and topical Rest of the treatment plan remains unchanged  Best Practice: Code Status: Full code Diet: N.p.o. while on BiPAP and resume cardiac diet once off BiPAP GI prophylaxis: Not indicated VTE prophylaxis: Lovenox and SCDs  FAMILY  - Updates: Change in condition related to family by nurse and hospitalist team  Basil Blakesley S. Tukov-Yual ANP-BC Pulmonary and Critical Care Medicine [facility] Pager [phone] or [phone]  NB: This document was prepared using Dragon voice recognition software and may include unintentional dictation errors.    02/24/2019, 1:13 AM

## 2019-02-24 NOTE — Consult Note (Signed)
Pharmacy Antibiotic Note  Matthew Washington. is a 63 y.o. male admitted on 03/13/2019 with aspiration pneumonia.  Pharmacy has been consulted for Meropenem dosing, due to patient having a PCN allergy.  Plan: Meropenem 1g Q8 Hours  Height: 6\' 3"  (190.5 cm) Weight: 201 lb 8 oz (91.4 kg) IBW/kg (Calculated) : 84.5  Temp (24hrs), Avg:98.3 F (36.8 C), Min:97.5 F (36.4 C), Max:99.1 F (37.3 C)  Recent Labs  Lab 02/20/19 0605 02/21/19 0236 02/21/19 0329 02/22/19 0748 02/23/19 0743 02/24/19 0517  WBC 7.7 6.5  --  9.5 10.4 10.4  CREATININE 0.72  --  0.69 0.98 0.83 0.75    Estimated Creatinine Clearance: 111.5 mL/min (by C-G formula based on SCr of 0.75 mg/dL).    Allergies  Allergen Reactions  . Shellfish Allergy Shortness Of Breath and Swelling  . Penicillins Hives  . Trazodone Other (See Comments)    Antimicrobials this admission: Meropenem 9/7 >>  Acyclovir 9/6 >>    Thank you for allowing pharmacy to be a part of this patient's care.  Matthew Washington 02/24/2019 11:16 AM

## 2019-02-25 ENCOUNTER — Encounter: Payer: Self-pay | Admitting: Primary Care

## 2019-02-25 DIAGNOSIS — R748 Abnormal levels of other serum enzymes: Secondary | ICD-10-CM

## 2019-02-25 DIAGNOSIS — Z515 Encounter for palliative care: Secondary | ICD-10-CM

## 2019-02-25 DIAGNOSIS — Z7189 Other specified counseling: Secondary | ICD-10-CM

## 2019-02-25 LAB — CBC WITH DIFFERENTIAL/PLATELET
Abs Immature Granulocytes: 0.08 10*3/uL — ABNORMAL HIGH (ref 0.00–0.07)
Basophils Absolute: 0 10*3/uL (ref 0.0–0.1)
Basophils Relative: 0 %
Eosinophils Absolute: 0 10*3/uL (ref 0.0–0.5)
Eosinophils Relative: 0 %
HCT: 36.5 % — ABNORMAL LOW (ref 39.0–52.0)
Hemoglobin: 13.4 g/dL (ref 13.0–17.0)
Immature Granulocytes: 1 %
Lymphocytes Relative: 3 %
Lymphs Abs: 0.3 10*3/uL — ABNORMAL LOW (ref 0.7–4.0)
MCH: 32.4 pg (ref 26.0–34.0)
MCHC: 36.7 g/dL — ABNORMAL HIGH (ref 30.0–36.0)
MCV: 88.2 fL (ref 80.0–100.0)
Monocytes Absolute: 0.3 10*3/uL (ref 0.1–1.0)
Monocytes Relative: 3 %
Neutro Abs: 8.3 10*3/uL — ABNORMAL HIGH (ref 1.7–7.7)
Neutrophils Relative %: 93 %
Platelets: 127 10*3/uL — ABNORMAL LOW (ref 150–400)
RBC: 4.14 MIL/uL — ABNORMAL LOW (ref 4.22–5.81)
RDW: 14.5 % (ref 11.5–15.5)
WBC: 8.9 10*3/uL (ref 4.0–10.5)
nRBC: 0 % (ref 0.0–0.2)

## 2019-02-25 LAB — GLUCOSE, CAPILLARY
Glucose-Capillary: 261 mg/dL — ABNORMAL HIGH (ref 70–99)
Glucose-Capillary: 269 mg/dL — ABNORMAL HIGH (ref 70–99)
Glucose-Capillary: 191 mg/dL — ABNORMAL HIGH (ref 70–99)
Glucose-Capillary: 310 mg/dL — ABNORMAL HIGH (ref 70–99)
Glucose-Capillary: 227 mg/dL — ABNORMAL HIGH (ref 70–99)

## 2019-02-25 LAB — BASIC METABOLIC PANEL
Anion gap: 13 (ref 5–15)
BUN: 20 mg/dL (ref 8–23)
CO2: 27 mmol/L (ref 22–32)
Calcium: 8.4 mg/dL — ABNORMAL LOW (ref 8.9–10.3)
Chloride: 85 mmol/L — ABNORMAL LOW (ref 98–111)
Creatinine, Ser: 0.67 mg/dL (ref 0.61–1.24)
GFR calc Af Amer: >60 mL/min (ref >60)
GFR calc non Af Amer: >60 mL/min (ref >60)
Glucose, Bld: 271 mg/dL — ABNORMAL HIGH (ref 70–99)
Potassium: 3.3 mmol/L — ABNORMAL LOW (ref 3.5–5.1)
Sodium: 125 mmol/L — ABNORMAL LOW (ref 135–145)

## 2019-02-25 LAB — HEPATITIS PANEL, ACUTE
HCV Ab: <0.1 s/co ratio (ref 0.0–0.9)
Hep A IgM: NEGATIVE
Hep B C IgM: NEGATIVE
Hepatitis B Surface Ag: NEGATIVE

## 2019-02-25 LAB — MAGNESIUM: Magnesium: 1.9 mg/dL (ref 1.7–2.4)

## 2019-02-25 MED ORDER — POTASSIUM CHLORIDE CRYS ER 20 MEQ PO TBCR
40.0000 meq | EXTENDED_RELEASE_TABLET | Freq: Once | ORAL | Status: AC
  Administered 2019-02-25: 40 meq via ORAL
  Filled 2019-02-25: qty 2

## 2019-02-25 MED ORDER — INFLUENZA VAC SPLIT QUAD 0.5 ML IM SUSY
0.5000 mL | PREFILLED_SYRINGE | INTRAMUSCULAR | Status: DC
  Filled 2019-02-25: qty 0.5

## 2019-02-25 MED ORDER — POTASSIUM CHLORIDE CRYS ER 20 MEQ PO TBCR
20.0000 meq | EXTENDED_RELEASE_TABLET | ORAL | Status: AC
  Administered 2019-02-25 (×2): 20 meq via ORAL
  Filled 2019-02-25 (×2): qty 1

## 2019-02-25 MED ORDER — VALACYCLOVIR HCL 500 MG PO TABS
1000.0000 mg | ORAL_TABLET | Freq: Three times a day (TID) | ORAL | Status: DC
  Administered 2019-02-25 – 2019-02-26 (×3): 1000 mg via ORAL
  Filled 2019-02-25 (×5): qty 2

## 2019-02-25 MED ORDER — MILRINONE LACTATE IN DEXTROSE 20-5 MG/100ML-% IV SOLN
0.3750 ug/kg/min | INTRAVENOUS | Status: DC
  Administered 2019-02-25 – 2019-02-26 (×3): 0.375 ug/kg/min via INTRAVENOUS
  Filled 2019-02-25 (×5): qty 100

## 2019-02-25 MED ORDER — CALCIUM CARBONATE ANTACID 500 MG PO CHEW
200.0000 mg | CHEWABLE_TABLET | Freq: Once | ORAL | Status: AC | PRN
  Administered 2019-02-25: 200 mg via ORAL

## 2019-02-25 MED ORDER — THIAMINE HCL 100 MG/ML IJ SOLN
500.0000 mg | Freq: Every day | INTRAVENOUS | Status: DC
  Administered 2019-02-25: 500 mg via INTRAVENOUS
  Filled 2019-02-25 (×4): qty 5

## 2019-02-25 MED ORDER — MAGNESIUM OXIDE 400 (241.3 MG) MG PO TABS
400.0000 mg | ORAL_TABLET | Freq: Two times a day (BID) | ORAL | Status: DC
  Administered 2019-02-25 – 2019-02-26 (×3): 400 mg via ORAL
  Filled 2019-02-25 (×3): qty 1

## 2019-02-25 NOTE — Progress Notes (Signed)
[facility]  ROUNDING NOTE   Subjective:   Na 125.   HFNC  Furosemide gtt 10mg /hr  IV albumin scheduled.   Objective:  Vital signs in last 24 hours:  Temp:  [98 F (36.7 C)-98.8 F (37.1 C)] 98.1 F (36.7 C) (09/08 0800) Pulse Rate:  [75-92] 84 (09/08 0800) Resp:  [14-27] 25 (09/08 0900) BP: (101-150)/(59-87) 119/66 (09/08 0900) SpO2:  [89 %-98 %] 94 % (09/08 0900) FiO2 (%):  [64 %-65 %] 64 % (09/08 0700) Weight:  [91.3 kg] 91.3 kg (09/08 0542)  Weight change: -0.1 kg Filed Weights   02/23/19 0337 02/23/19 2250 02/25/19 0542  Weight: 91 kg 91.4 kg 91.3 kg    Intake/Output: I/O last 3 completed shifts: In: 1226 [I.V.:323.6; IV Piggyback:902.4] Out: 2475 [Urine:2475]   Intake/Output this shift:  Total I/O In: 685.2 [P.O.:240; I.V.:108.8; IV Piggyback:336.4] Out: -   Physical Exam: General: Critically ill   Head: Normocephalic, atraumatic.    Eyes: +icterus  Neck: Supple, trachea midline  Lungs:  Bilateral crackles  Heart: regular  Abdomen:  Soft, nontender, bowel sounds present  Extremities: 2+ peripheral edema.  Neurologic: Alert and oriented  Skin: No lesions       Basic Metabolic Panel: Recent Labs  Lab 02/20/19 0605  02/21/19 0329  02/21/19 1421 02/22/19 0748  02/23/19 0743  02/23/19 2345 02/24/19 0517 02/24/19 0826 02/24/19 1533 02/25/19 0250  NA 117*   < > 119*   < >  --  122*   < > 124*   < > 124* 126* 125* 125* 125*  K 3.3*   < > 3.2*  --  3.7 4.6  --  3.7  --   --  3.1*  --   --  3.3*  CL 82*  --  81*  --   --  84*  --  86*  --   --  87*  --   --  85*  CO2 21*  --  24  --   --  23  --  27  --   --  28  --   --  27  GLUCOSE 111*  --  122*  --   --  142*  --  121*  --   --  110*  --   --  271*  BUN 14  --  11  --   --  14  --  16  --   --  17  --   --  20  CREATININE 0.72  --  0.69  --   --  0.98  --  0.83  --   --  0.75  --   --  0.67  CALCIUM 8.4*  --  8.3*  --   --  8.8*  --  8.8*  --   --  8.4*  --   --  8.4*  MG 1.4*   --  1.5*  --  2.0 1.7  --  1.7  --   --  1.7  --   --  1.9  PHOS 3.3  --  3.3  --   --  2.4*  --  3.7  --   --   --   --   --   --    < > = values in this interval not displayed.    Liver Function Tests: Recent Labs  Lab 02/24/2019 1429 02/20/19 0605 02/21/19 0329 02/22/19 0748 02/24/19 0517  AST 257* 155* 117* 98* 55*  ALT  189* 136* 107* 92* 57*  ALKPHOS 152* 133* 164* 146* 156*  BILITOT 5.0* 4.5* 5.1* 5.5* 6.1*  PROT 7.5 6.6 6.8 6.9 6.7  ALBUMIN 3.4* 3.0* 3.6 3.7 3.7   No results for input(s): LIPASE, AMYLASE in the last 168 hours. No results for input(s): AMMONIA in the last 168 hours.  CBC: Recent Labs  Lab 09-28-18 1429 02/20/19 0605 02/21/19 0236 02/22/19 0748 02/23/19 0743 02/24/19 0517 02/25/19 0250  WBC 8.0 7.7 6.5 9.5 10.4 10.4 8.9  NEUTROABS 6.7 6.7 5.5 8.0*  --   --  8.3*  HGB 15.2 14.3 13.4 14.8 13.4 12.8* 13.4  HCT 41.0 38.4* 36.5* 41.5 37.8* 35.8* 36.5*  MCV 88.7 88.1 88.4 89.4 90.2 90.9 88.2  PLT 155 150 138* 132* 122* 116* 127*    Cardiac Enzymes: No results for input(s): CKTOTAL, CKMB, CKMBINDEX, TROPONINI in the last 168 hours.  BNP: Invalid input(s): POCBNP  CBG: Recent Labs  Lab 02/24/19 1157 02/24/19 1542 02/24/19 2059 02/25/19 0720 02/25/19 1120  GLUCAP 140* 147* 205* 261* 269*    Microbiology: Results for orders placed or performed during the hospital encounter of 09-28-18  SARS Coronavirus 2 [facility](Hospital order, Performed in [facility] lab) Nasopharyngeal Nasopharyngeal Swab     Status: None   Collection Time: 09-28-18  2:33 PM   Specimen: Nasopharyngeal Swab  Result Value Ref Range Status   SARS Coronavirus 2 NEGATIVE NEGATIVE Final    Comment: (NOTE) If result is NEGATIVE SARS-CoV-2 target nucleic acids are NOT DETECTED. The SARS-CoV-2 RNA is generally detectable in upper and lower  respiratory specimens during the acute phase of infection. The lowest  concentration of SARS-CoV-2 viral copies this assay can detect  is 250  copies / mL. A negative result does not preclude SARS-CoV-2 infection  and should not be used as the sole basis for treatment or other  patient management decisions.  A negative result may occur with  improper specimen collection / handling, submission of specimen other  than nasopharyngeal swab, presence of viral mutation(s) within the  areas targeted by this assay, and inadequate number of viral copies  (<250 copies / mL). A negative result must be combined with clinical  observations, patient history, and epidemiological information. If result is POSITIVE SARS-CoV-2 target nucleic acids are DETECTED. The SARS-CoV-2 RNA is generally detectable in upper and lower  respiratory specimens dur ing the acute phase of infection.  Positive  results are indicative of active infection with SARS-CoV-2.  Clinical  correlation with patient history and other diagnostic information is  necessary to determine patient infection status.  Positive results do  not rule out bacterial infection or co-infection with other viruses. If result is PRESUMPTIVE POSTIVE SARS-CoV-2 nucleic acids MAY BE PRESENT.   A presumptive positive result was obtained on the submitted specimen  and confirmed on repeat testing.  While 2019 novel coronavirus  (SARS-CoV-2) nucleic acids may be present in the submitted sample  additional confirmatory testing may be necessary for epidemiological  and / or clinical management purposes  to differentiate between  SARS-CoV-2 and other Sarbecovirus currently known to infect humans.  If clinically indicated additional testing with an alternate test  methodology [phone]) is advised. The SARS-CoV-2 RNA is generally  detectable in upper and lower respiratory sp ecimens during the acute  phase of infection. The expected result is Negative. Fact Sheet for Patients:  BoilerBrush.com.cyhttps://www.fda.gov/media/136312/download Fact Sheet for Healthcare  Providers: https://pope.com/https://www.fda.gov/media/136313/download This test is not yet approved or cleared by the [place] FDA  and has been authorized for detection and/or diagnosis of SARS-CoV-2 by FDA under an Emergency Use Authorization (EUA).  This EUA will remain in effect (meaning this test can be used) for the duration of the COVID-19 declaration under Section 564(b)(1) of the Act, 21 U.S.C. section 360bbb-3(b)(1), unless the authorization is terminated or revoked sooner. Performed at [facility], [address] [place], [place] [address]   MRSA PCR Screening     Status: None   Collection Time: 02/20/2019  6:27 PM   Specimen: Nasopharyngeal  Result Value Ref Range Status   MRSA by PCR NEGATIVE NEGATIVE Final    Comment:        The GeneXpert MRSA Assay (FDA approved for NASAL specimens only), is one component of a comprehensive MRSA colonization surveillance program. It is not intended to diagnose MRSA infection nor to guide or monitor treatment for MRSA infections. Performed at [facility], [address] [place], [place] [address]     Coagulation Studies: No results for input(s): LABPROT, INR in the last 72 hours.  Urinalysis: No results for input(s): COLORURINE, LABSPEC, PHURINE, GLUCOSEU, HGBUR, BILIRUBINUR, KETONESUR, PROTEINUR, UROBILINOGEN, NITRITE, LEUKOCYTESUR in the last 72 hours.  Invalid input(s): APPERANCEUR    Imaging: Dg Chest Port 1 View  Result Date: 02/23/2019 CLINICAL DATA:  Hypoxia EXAM: PORTABLE CHEST 1 VIEW COMPARISON:  02/20/2019 FINDINGS: Severe diffuse airspace disease throughout the lungs, similar to prior study. Cardiomegaly. No effusions or pneumothorax. No acute bony abnormality. IMPRESSION: Severe diffuse bilateral airspace disease, unchanged. Electronically Signed   By: Charlett Nose M.D.   On: 02/23/2019 21:06     Medications:   . sodium chloride 100 mL (02/24/19 0613)  . furosemide (LASIX) infusion 10 mg/hr (02/24/19  2159)  . milrinone    . thiamine injection 500 mg (02/25/19 1136)   . acyclovir ointment   Topical Q8H  . budesonide (PULMICORT) nebulizer solution  0.5 mg Nebulization BID  . carvedilol  3.125 mg Oral BID WC  . Chlorhexidine Gluconate Cloth  6 each Topical Daily  . enoxaparin (LOVENOX) injection  40 mg Subcutaneous Q24H  . feeding supplement  1 Container Oral TID BM  . FLUoxetine  40 mg Oral Daily  . folic acid  1 mg Oral Daily  . insulin aspart  0-5 Units Subcutaneous QHS  . insulin aspart  0-9 Units Subcutaneous TID WC  . ipratropium-albuterol  3 mL Nebulization Q4H  . magnesium oxide  400 mg Oral BID  . mouth rinse  15 mL Mouth Rinse BID  . methylPREDNISolone (SOLU-MEDROL) injection  40 mg Intravenous Q12H  . multivitamin with minerals  1 tablet Oral Daily  . pneumococcal 23 valent vaccine  0.5 mL Intramuscular Tomorrow-1000  . sacubitril-valsartan  1 tablet Oral BID  . valACYclovir  1,000 mg Oral TID  . zolpidem  5 mg Oral QHS   sodium chloride, acetaminophen **OR** acetaminophen, hydrALAZINE, morphine injection, ondansetron **OR** ondansetron (ZOFRAN) IV  Assessment/ Plan:  63 y.o. male  Mr. Matthew Washington. is a 63 y.o. white male with diabetes mellitus type 2, hypertension, depression, hyperlipidemia, systolic congestive heart failure who was admitted to [facility] on 02/20/19    1.  Hyponatremia: secondary to volume overload. - Continue furosemide gtt - Discontinue IV albumin - Milrinone as per ICU  2. Hypokalemia: secondary to diuresis and alcoholic nephropathy - PO potassium administered this morning.   3. Hypertension: blood pressure at goal.  4. Systolic congestive heart failureL EF 20-25%. 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  -  IV thiamine    LOS: 6 Matthew Washington 9/8/202012:27 PM

## 2019-02-25 NOTE — Progress Notes (Addendum)
After further assessment and discussion of patient's  status and medical condition during multidisciplinary rounds, the plan is outlined as below:  1.  Discussed with nephrology will initiate milrinone given cardiorenal syndrome.  Hopefully this will assist with diuresis and reduce FiO2 requirements.  2.  Discussed with palliative care patient now is DNR.  Contemplating potential transition to comfort measures if does not respond to diuresis and inotropic support  3.  Continue high-dose thiamine  4.  Discussed in detail at multidisciplinary rounds.   Renold Don, MD [facility]

## 2019-02-25 NOTE — Progress Notes (Addendum)
[place] [facility] [place] NAME: Matthew Washington    MR#:  [id number]  DATE OF BIRTH:  [birth year]  SUBJECTIVE:  CHIEF COMPLAINT:   Chief Complaint  Patient presents with   Weakness   -Still remains on high flow, generalized edema.  On Lasix drip.  Sodium is at 125  REVIEW OF SYSTEMS:  Review of Systems  Constitutional: Positive for malaise/fatigue. Negative for chills and fever.  HENT: Negative for ear discharge, hearing loss and nosebleeds.   Respiratory: Positive for shortness of breath. Negative for cough and wheezing.   Cardiovascular: Positive for leg swelling. Negative for chest pain and palpitations.  Gastrointestinal: Negative for abdominal pain, constipation, diarrhea, nausea and vomiting.  Genitourinary: Negative for dysuria.  Musculoskeletal: Positive for myalgias.  Skin: Positive for rash.  Neurological: Positive for weakness. Negative for dizziness, speech change, focal weakness, seizures and headaches.  Psychiatric/Behavioral: Negative for depression.    DRUG ALLERGIES:   Allergies  Allergen Reactions   Shellfish Allergy Shortness Of Breath and Swelling   Penicillins Hives   Trazodone Other (See Comments)    VITALS:  Blood pressure 119/66, pulse 84, temperature 98.1 F (36.7 C), temperature source Oral, resp. rate (!) 25, height 6\' 3"  (1.905 m), weight 91.3 kg, SpO2 94 %.  PHYSICAL EXAMINATION:  Physical Exam   GENERAL:  63 y.o.-year-old patient lying in the bed, chronically ill-appearing EYES: Pupils equal, round, reactive to light and accommodation. No scleral icterus. Extraocular muscles intact.  HEENT: Head atraumatic, normocephalic. Oropharynx and nasopharynx clear.  NECK:  Supple, no jugular venous distention. No thyroid enlargement, no tenderness.  LUNGS: Normal breath sounds bilaterally, no wheezing, rales,rhonchi or crepitation. No use of accessory muscles of respiration.  Decreased bibasilar breath  sounds CARDIOVASCULAR: S1, S2 normal. No murmurs, rubs, or gallops.  ABDOMEN: Significant abdominal wall edema.  Soft, nontender, distended. Bowel sounds present. No organomegaly or mass.  EXTREMITIES: No  cyanosis, or clubbing.  1+ pedal edema noted. NEUROLOGIC: Cranial nerves II through XII are intact. Muscle strength 5/5 in all extremities. Sensation intact. Gait not checked.  Severe global weakness PSYCHIATRIC: The patient is alert and oriented x 3.  SKIN: No obvious rash, lesion, erythema along the lower left abdominal wall spreading anteriorly with some vesicular lesions.   LABORATORY PANEL:   CBC Recent Labs  Lab 02/25/19 0250  WBC 8.9  HGB 13.4  HCT 36.5*  PLT 127*   ------------------------------------------------------------------------------------------------------------------  Chemistries  Recent Labs  Lab 02/24/19 0517  02/25/19 0250  NA 126*   < > 125*  K 3.1*  --  3.3*  CL 87*  --  85*  CO2 28  --  27  GLUCOSE 110*  --  271*  BUN 17  --  20  CREATININE 0.75  --  0.67  CALCIUM 8.4*  --  8.4*  MG 1.7  --  1.9  AST 55*  --   --   ALT 57*  --   --   ALKPHOS 156*  --   --   BILITOT 6.1*  --   --    < > = values in this interval not displayed.   ------------------------------------------------------------------------------------------------------------------  Cardiac Enzymes No results for input(s): TROPONINI in the last 168 hours. ------------------------------------------------------------------------------------------------------------------  RADIOLOGY:  Dg Chest Port 1 View  Result Date: 02/23/2019 CLINICAL DATA:  Hypoxia EXAM: PORTABLE CHEST 1 VIEW COMPARISON:  02/20/2019 FINDINGS: Severe diffuse airspace disease throughout the lungs, similar to prior study. Cardiomegaly. No effusions  or pneumothorax. No acute bony abnormality. IMPRESSION: Severe diffuse bilateral airspace disease, unchanged. Electronically Signed   By: Charlett NoseKevin  Dover M.D.   On:  02/23/2019 21:06    EKG:   Orders placed or performed during the hospital encounter of Feb 05, 2019   EKG 12-Lead   EKG 12-Lead   ED EKG   ED EKG    ASSESSMENT AND PLAN:   63 year old male with past medical history significant for hypertension, diabetes, major depressive disorder, history of alcohol use presents to hospital secondary to difficulty breathing  1.  Acute hypoxic respiratory failure-secondary to acute systolic heart failure exacerbation,   Patient is on ICU, on high flow nasal cannula. -Appreciate cardiology consult.  Echocardiogram with EF of 20 to 25%.  Patient remains on Lasix drip and IV albumin. -IV steroids have been added. -Started on milrinone -Blood pressure allows, continue Entresto and Coreg  2.  Hyponatremia-secondary to CHF, hypervolemia -Continue Lasix drip.  Recommend fluid restriction.  Appreciate nephrology consult. -Sodium at 125  3.  Left abdominal wall shingles-on Valtrex  4.  Elevated LFTs-ultrasound abdomen with cirrhosis and gallbladder sludge.  History of alcohol use. -Continue thiamine and other supplements. -Monitor for any withdrawals  5.  DVT prophylaxis-Lovenox  Lives at home with his male partner   All the records are reviewed and case discussed with Care Management/Social Workerr. Management plans discussed with the patient, family and they are in agreement.  CODE STATUS:  DNR  TOTAL TIME TAKING CARE OF THIS PATIENT: 39 minutes.   POSSIBLE D/C IN 1-2 DAYS, DEPENDING ON CLINICAL CONDITION.   Enid Baasadhika Eve Rey M.D on 02/25/2019 at 1:49 PM  Between 7am to 6pm - Pager - [phone]  After 6pm go to [email or web address] - password [email or web address]  [facility]  Office  [phone]  CC: Primary care physician; Patient, No Pcp Per

## 2019-02-25 NOTE — Progress Notes (Signed)
Patient declining Bipap and Heated High Flow [place] at this time. Refusing Bipap because he "absolutely do not like it" and cannot wear his glasses. Refusing HHFNC due to his nose feeling raw and sore, despite humidity.  Patient is currently on Non-Rebreather Mask. Patient was trialed on venturi mask 14L 55% but was placed back on NRBM due to desaturation. Patient breath sounds: fine crackles right side, diminished on left. Will continue to monitor and wean as appropriate. RN aware.

## 2019-02-25 NOTE — Progress Notes (Signed)
   02/25/19 1500  Clinical Encounter Type  Visited With Patient  Visit Type Initial  Referral From Nurse  Consult/Referral To Chaplain  Spiritual Encounters  Spiritual Needs Brochure   Chaplain received an OR to complete or update an AD. Upon arrival, the patient was sitting up in bed and he was alert and oriented. The patient confirmed interest in completing the AD paperwork, acknowledging that he wishes for his partner, Elenore Rota [facility]) to be named HPOA. Chaplains Freda Munro and [place] educated the patient on parts A&B of the document and offered compassionate ministerial presence as the patient shared some of his EOL wishes (cremation, no aggressive treatments, etc.). The patient reported that he is unable to write steadily to complete the paperwork and will need assistance doing so as a result of a prior injury/break to his right arm. The patient also gave verbal permission for the chaplain staff to talk with Elenore Rota [facility]) via phone regarding the HPOA. Chaplains will proceed with securing a notary and witnesses to complete the paperwork tomorrow (02/25/19).

## 2019-02-25 NOTE — Progress Notes (Signed)
PT Cancellation Note  Patient Details Name: Matthew Washington. MRN: [id number] DOB: Mar 16, 1956   Cancelled Treatment:    Reason Eval/Treat Not Completed: Medical issues which prohibited therapy Pt continues to have low sodium and other electrolyte/lab values, will try back when pt is more appropriate.  Kreg Shropshire, DPT 02/25/2019, 5:24 PM

## 2019-02-25 NOTE — Progress Notes (Signed)
Pharmacy Electrolyte Monitoring Consult:  Pharmacy consulted to assist in monitoring and replacing electrolytes in this 63 y.o. male admitted on Mar 08, 2019. Patient now extubated. Patient requiring milrinone and furosemide infusions.   Labs:  Sodium (mmol/L)  Date Value  02/25/2019 125 (L)  11/11/2014 139  04/16/2014 137   Potassium (mmol/L)  Date Value  02/25/2019 3.3 (L)  04/16/2014 4.4   Magnesium (mg/dL)  Date Value  02/25/2019 1.9   Phosphorus (mg/dL)  Date Value  02/23/2019 3.7   Calcium (mg/dL)  Date Value  02/25/2019 8.4 (L)   Calcium, Total (mg/dL)  Date Value  04/16/2014 8.3 (L)   Albumin (g/dL)  Date Value  02/24/2019 3.7  01/16/2014 4.0    Assessment/Plan: Albumin stopped by nephrology.   Potassium 26mEq PO Q4hr x 2 doses ordered overnight. Will order additional potassium 25mEq PO x 1.   Will check BMP/Magnesium with am labs.   Will replace for goal potassium ~ 4 and goal magnesium ~ 2.   Pharmacy will continue to monitor and adjust per consult.   Matthew Washington L 02/25/2019 3:57 PM

## 2019-02-25 NOTE — Progress Notes (Signed)
Inpatient Diabetes Program Recommendations  AACE/ADA: New Consensus Statement on Inpatient Glycemic Control (2015)  Target Ranges:  Prepandial:   less than 140 mg/dL      Peak postprandial:   less than 180 mg/dL (1-2 hours)      Critically ill patients:  140 - 180 mg/dL   Lab Results  Component Value Date   GLUCAP 261 (H) 02/25/2019   HGBA1C 5.6 02-20-2019    Review of Glycemic Control Results for OSMAN, CALZADILLA (MRN [id number]) as of 02/25/2019 11:12  Ref. Range 02/23/2019 22:40 02/24/2019 11:57 02/24/2019 15:42 02/24/2019 20:59 02/25/2019 07:20  Glucose-Capillary Latest Ref Range: 70 - 99 mg/dL 128 (H) 140 (H) 147 (H) 205 (H) 261 (H)   Diabetes history: DM 2 Outpatient Diabetes medications: Metformin 500 mg bid Current orders for Inpatient glycemic control: Novolog 0-9 units + hs scale  A1c 5.6% on 9/2 Solumedrol 40 mg Q12 hours  Inpatient Diabetes Program Recommendations:    Started on Solumedrol 40 mg q 12 hours yesterday Consider increasing Correction scale to Novolog 0-15 units.  Thanks,  Tama Headings RN, MSN, BC-ADM Inpatient Diabetes Coordinator Team Pager [phone] (8a-5p)

## 2019-02-25 NOTE — Consult Note (Signed)
Consultation Note Date: 02/25/2019   Patient Name: Matthew Washington.  DOB: 03/13/1956  MRN: [id number]  Age / Sex: 63 y.o., male  PCP: Patient, No Pcp Per Referring Physician: Gladstone Lighter, MD  Reason for Consultation: Establishing goals of care  HPI/Patient Profile: 63 y.o. male  with past medical history of DM, HTN, broken R humerus, MDD on meds for 20 years admitted on 03/18/2019 with Acute hypoxic respiratory failure secondary to acute systolic heart failure and possible HCAP.   Clinical Assessment and Goals of Care: I have reviewed medical records including EPIC notes, labs and imaging, received report from bedside nursing staff, assessed the patient and then met at the bedside to discuss diagnosis prognosis, GOC, EOL wishes, disposition and options.  I introduced Palliative Medicine as specialized medical care for people living with serious illness. It focuses on providing relief from the symptoms and stress of a serious illness. The goal is to improve quality of life for both the patient and the family.  We discussed a brief life review of the patient. Xaiver shares that he moved to [place] in the 1970's, "disco scene", living there for 27 years.  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  He is not married but has a partner, Dietitian.   His father is alive, aged 29 and he has a 72 year old sister.   As far as functional and nutritional status he is independent with ADL's, managing his executive functions. has been smoking and drinking.   We discussed current illness and what it means in the larger context of on-going co-morbidities. Zayne tells me that he has not talked with doctors about his condition.  He shares that his sister and father drive in from [place] last night teling him that they had spoke with doctors who told him "you're dying, why didn't  you tell us".  Eino shares that his sister heard this from a doctor.    I attempted to elicit values and goals of care important to the patient.  Fuad tells me that his had tried to lead a good life, but is not religous. He feels that there is nothing after death.   The difference between aggressive medical intervention and comfort care was considered in light of the patient's goals of care.   Advanced directives, concepts specific to code status and HCPOA, were considered and discussed.  Jemar would like to "allow a natural death" (DNR), telling a story of his aunt's passing. Orders changed.   Questions and concerns were addressed.  The family was encouraged to call with questions or concerns.  PMT to follow up Thursdays.   HCPOA    OTHER - Takumi tells me that he wants his partner Shelbie Proctor to be his healthcare surrogate.  Chaplain notified to completed HCPOA paperwork.    SUMMARY OF RECOMMENDATIONS   24-48 hours for outcomes  DNR status, but treat the treatable.   Code Status/Advance Care Planning:  DNR  Symptom  Management:   Per hospitalist/CCM, no additional needs at this time.   Palliative Prophylaxis:   Oral Care and Turn Reposition  Additional Recommendations (Limitations, Scope, Preferences):  treat the treatable, no heroic measures.   Psycho-social/Spiritual:   Desire for further Chaplaincy support:no  Additional Recommendations: Caregiving  Support/Resources and Referral to Community Resources   Prognosis:   Unable to determine, based on outcomes.   Discharge Planning: to be determined, based on outcomes.       Primary Diagnoses: Present on Admission: . Acute hyponatremia   I have reviewed the medical record, interviewed the patient and family, and examined the patient. The following aspects are pertinent.  Past Medical History:  Diagnosis Date  . Allergy    seafood  . Broken arm 06/2014  . Diabetes mellitus without complication ([facility])   .  Hypertension   . MDD (major depressive disorder)    Social History   Socioeconomic History  . Marital status: Single    Spouse name: Not on file  . Number of children: Not on file  . Years of education: Not on file  . Highest education level: Not on file  Occupational History  . Not on file  Social Needs  . Financial resource strain: Not on file  . Food insecurity    Worry: Not on file    Inability: Not on file  . Transportation needs    Medical: Not on file    Non-medical: Not on file  Tobacco Use  . Smoking status: Current Every Day Smoker    Packs/day: 1.00  . Smokeless tobacco: Never Used  Substance and Sexual Activity  . Alcohol use: Yes    Alcohol/week: 21.0 standard drinks    Types: 21 Shots of liquor per week  . Drug use: No  . Sexual activity: Not Currently  Lifestyle  . Physical activity    Days per week: Not on file    Minutes per session: Not on file  . Stress: Not on file  Relationships  . Social Herbalist on phone: Not on file    Gets together: Not on file    Attends religious service: Not on file    Active member of club or organization: Not on file    Attends meetings of clubs or organizations: Not on file    Relationship status: Not on file  Other Topics Concern  . Not on file  Social History Narrative   Lives with husband Delice Lesch   Family History  Problem Relation Age of Onset  . Diabetes Mother   . Hypertension Mother   . Hypertension Father   . CVA Father    Scheduled Meds: . acyclovir ointment   Topical Q8H  . budesonide (PULMICORT) nebulizer solution  0.5 mg Nebulization BID  . carvedilol  3.125 mg Oral BID WC  . Chlorhexidine Gluconate Cloth  6 each Topical Daily  . enoxaparin (LOVENOX) injection  40 mg Subcutaneous Q24H  . feeding supplement  1 Container Oral TID BM  . FLUoxetine  40 mg Oral Daily  . folic acid  1 mg Oral Daily  . insulin aspart  0-5 Units Subcutaneous QHS  . insulin aspart  0-9 Units  Subcutaneous TID WC  . ipratropium-albuterol  3 mL Nebulization Q4H  . magnesium oxide  400 mg Oral BID  . mouth rinse  15 mL Mouth Rinse BID  . methylPREDNISolone (SOLU-MEDROL) injection  40 mg Intravenous Q12H  . multivitamin with minerals  1 tablet Oral  Daily  . pneumococcal 23 valent vaccine  0.5 mL Intramuscular Tomorrow-1000  . sacubitril-valsartan  1 tablet Oral BID  . valACYclovir  1,000 mg Oral TID  . zolpidem  5 mg Oral QHS   Continuous Infusions: . sodium chloride 100 mL (02/24/19 1324)  . furosemide (LASIX) infusion 10 mg/hr (02/24/19 2159)  . milrinone    . thiamine injection 500 mg (02/25/19 1136)   PRN Meds:.sodium chloride, acetaminophen **OR** acetaminophen, hydrALAZINE, morphine injection, ondansetron **OR** ondansetron (ZOFRAN) IV Medications Prior to Admission:  Prior to Admission medications   Medication Sig Start Date End Date Taking? Authorizing Provider  FLUoxetine (PROZAC) 40 MG capsule Take by mouth.   Yes [provider]  zolpidem (AMBIEN) 10 MG tablet Take 10 mg by mouth at bedtime.   Yes [provider]  aspirin 325 MG tablet Take 325 mg by mouth daily.    [provider]  lisinopril (ZESTRIL) 5 MG tablet Take 5 mg by mouth daily.    [provider]  metFORMIN (GLUCOPHAGE) 500 MG tablet Take 500 mg by mouth 2 (two) times daily.    [provider]  nortriptyline (PAMELOR) 25 MG capsule Take by mouth.    [provider]  simvastatin (ZOCOR) 20 MG tablet Take 20 mg by mouth Nightly.    [provider]   Allergies  Allergen Reactions  . Shellfish Allergy Shortness Of Breath and Swelling  . Penicillins Hives  . Trazodone Other (See Comments)   Review of Systems  Unable to perform ROS: Acuity of condition    Physical Exam Vitals signs and nursing note reviewed.  Constitutional:      General: He is not in acute distress.    Appearance: He is normal weight. He is ill-appearing.   Cardiovascular:     Rate and Rhythm: Regular rhythm.  Pulmonary:     Effort: Pulmonary effort is normal.  Abdominal:     General: Abdomen is flat.     Palpations: Abdomen is soft.  Skin:    General: Skin is warm and dry.  Neurological:     Mental Status: He is alert and oriented to person, place, and time.  Psychiatric:        Mood and Affect: Mood normal.        Behavior: Behavior normal.     Vital Signs: BP 119/66   Pulse 84   Temp 98.1 F (36.7 C) (Oral)   Resp (!) 25   Ht '6\' 3"'$  (1.905 m)   Wt 91.3 kg   SpO2 94%   BMI 25.16 kg/m  Pain Scale: 0-10 POSS *See Group Information*: 1-Acceptable,Awake and alert Pain Score: 0-No pain   SpO2: SpO2: 94 % O2 Device:SpO2: 94 % O2 Flow Rate: .O2 Flow Rate (L/min): 29.4 L/min  IO: Intake/output summary:   Intake/Output Summary (Last 24 hours) at 02/25/2019 1143 Last data filed at 02/25/2019 0900 Gross per 24 hour  Intake 1292.65 ml  Output 1700 ml  Net -407.35 ml    LBM: Last BM Date: 02/23/19 Baseline Weight: Weight: 75 kg Most recent weight: Weight: 91.3 kg     Palliative Assessment/Data:   Flowsheet Rows     Most Recent Value  Intake Tab  Referral Department  Hospitalist  Unit at Time of Referral  Intermediate Care Unit  Palliative Care Primary Diagnosis  Cardiac  Date Notified  02/24/19  Palliative Care Type  New Palliative care  Reason for referral  Clarify Goals of Care  Date of Admission  02/21/2019  Date first seen by Palliative Care  02/25/19  # of days Palliative referral response time  1 Day(s)  # of days IP prior to Palliative referral  5  Clinical Assessment  Palliative Performance Scale Score  40%  Pain Max last 24 hours  Not able to report  Pain Min Last 24 hours  Not able to report  Dyspnea Max Last 24 Hours  Not able to report  Dyspnea Min Last 24 hours  Not able to report  Psychosocial & Spiritual Assessment  Palliative Care Outcomes      Time In: 1020 Time Out: 1130 Time Total: 70  minutes  Greater than 50%  of this time was spent counseling and coordinating care related to the above assessment and plan.  Signed by: Drue Novel, NP   Please contact Palliative Medicine Team phone at [phone] for questions and concerns.  For individual provider: See Shea Evans

## 2019-02-26 ENCOUNTER — Inpatient Hospital Stay: Payer: Medicaid Other

## 2019-02-26 DIAGNOSIS — J9600 Acute respiratory failure, unspecified whether with hypoxia or hypercapnia: Secondary | ICD-10-CM

## 2019-02-26 LAB — BASIC METABOLIC PANEL
Anion gap: 11 (ref 5–15)
BUN: 23 mg/dL (ref 8–23)
CO2: 28 mmol/L (ref 22–32)
Calcium: 8.7 mg/dL — ABNORMAL LOW (ref 8.9–10.3)
Chloride: 87 mmol/L — ABNORMAL LOW (ref 98–111)
Creatinine, Ser: 0.61 mg/dL (ref 0.61–1.24)
GFR calc Af Amer: >60 mL/min (ref >60)
GFR calc non Af Amer: >60 mL/min (ref >60)
Glucose, Bld: 248 mg/dL — ABNORMAL HIGH (ref 70–99)
Potassium: 3.7 mmol/L (ref 3.5–5.1)
Sodium: 126 mmol/L — ABNORMAL LOW (ref 135–145)

## 2019-02-26 LAB — MAGNESIUM: Magnesium: 1.7 mg/dL (ref 1.7–2.4)

## 2019-02-26 LAB — GLUCOSE, CAPILLARY
Glucose-Capillary: 230 mg/dL — ABNORMAL HIGH (ref 70–99)
Glucose-Capillary: 232 mg/dL — ABNORMAL HIGH (ref 70–99)

## 2019-02-26 MED ORDER — MORPHINE SULFATE (PF) 2 MG/ML IV SOLN: 2.0000 mg | INTRAVENOUS | Status: DC | PRN

## 2019-02-26 MED ORDER — MORPHINE SULFATE (PF) 2 MG/ML IV SOLN
1.0000 mg | INTRAVENOUS | Status: DC | PRN
  Administered 2019-02-26 (×3): 2 mg via INTRAVENOUS
  Filled 2019-02-26 (×3): qty 1

## 2019-02-26 MED ORDER — ZOLPIDEM TARTRATE 5 MG PO TABS
5.0000 mg | ORAL_TABLET | Freq: Every day | ORAL | Status: DC
  Administered 2019-02-26: 5 mg via ORAL

## 2019-02-26 MED ORDER — INSULIN ASPART 100 UNIT/ML ~~LOC~~ SOLN
0.0000 [IU] | Freq: Three times a day (TID) | SUBCUTANEOUS | Status: DC
  Administered 2019-02-26: 5 [IU] via SUBCUTANEOUS
  Filled 2019-02-26: qty 1

## 2019-02-26 MED ORDER — LORAZEPAM 2 MG/ML IJ SOLN: 2.0000 mg | INTRAMUSCULAR | Status: DC | PRN

## 2019-02-26 MED ORDER — INSULIN ASPART 100 UNIT/ML ~~LOC~~ SOLN: 0.0000 [IU] | Freq: Every day | SUBCUTANEOUS | Status: DC

## 2019-02-26 MED ORDER — MAGNESIUM SULFATE 2 GM/50ML IV SOLN: 2.0000 g | Freq: Once | INTRAVENOUS | Status: DC

## 2019-02-26 MED ORDER — POTASSIUM CHLORIDE CRYS ER 20 MEQ PO TBCR
40.0000 meq | EXTENDED_RELEASE_TABLET | Freq: Once | ORAL | Status: AC
  Administered 2019-02-26: 11:00:00 40 meq via ORAL
  Filled 2019-02-26: qty 2

## 2019-02-26 MED ORDER — CALCIUM CARBONATE ANTACID 500 MG PO CHEW
2.0000 | CHEWABLE_TABLET | Freq: Once | ORAL | Status: AC
  Administered 2019-02-26: 400 mg via ORAL

## 2019-02-26 MED ORDER — MAGNESIUM OXIDE 400 (241.3 MG) MG PO TABS: 800.0000 mg | ORAL_TABLET | Freq: Two times a day (BID) | ORAL | Status: DC

## 2019-02-26 MED ORDER — ALPRAZOLAM 0.5 MG PO TABS: 0.5000 mg | ORAL_TABLET | Freq: Two times a day (BID) | ORAL | Status: DC | PRN

## 2019-02-26 MED ORDER — ZOLPIDEM TARTRATE 5 MG PO TABS: 10.0000 mg | ORAL_TABLET | Freq: Every day | ORAL | Status: DC

## 2019-03-03 ENCOUNTER — Telehealth: Payer: Self-pay

## 2019-03-03 NOTE — Telephone Encounter (Signed)
Death certificate has been completed and placed up front for pick up.  Katie with [employer] is aware and voiced her understanding.

## 2019-03-03 NOTE — Telephone Encounter (Signed)
Death certificate has been received from [facility] and given to Dr. Patsey Berthold.

## 2019-03-04 ENCOUNTER — Other Ambulatory Visit: Payer: Self-pay

## 2019-03-20 NOTE — Progress Notes (Signed)
Patient took off his NRBM, states he does not want to wear it because it is drying out his mouth and airway. Requesting coffee at this time.  RT to patient bedside for scheduled breathing treatment. SAT 63% on Room Air upon arrival, patient speaking in full sentences despite low SPO2 and increased Respirations. Patient educated on oxygen delivery device options and importance of maintaining acceptable oxygen levels. Patient reluctantly agreed to go back on heated high flow nasal cannula, 50L 100%. Patient has bilateral diminished, fine crackle breath sounds. RN aware.

## 2019-03-20 NOTE — Progress Notes (Signed)
   2019/03/12 1300  Clinical Encounter Type  Visited With Patient and family together  Visit Type Follow-up;Spiritual support  Referral From Palliative care team  Consult/Referral To Chaplain  Spiritual Encounters  Spiritual Needs Other (Comment)  Stress Factors  Family Stress Factors Major life changes;Other (Comment)  Chaplain visit patient to follow up on patient going to comfort care. Patient and Chaplain talked about patient after life transition. Patient is at peace with his decision on not having any belief. Patient was very appreciative for talking to him.

## 2019-03-20 NOTE — Progress Notes (Signed)
Daily Progress Note   Patient Name: Matthew Washington.       Date: 2019-03-22 DOB: 1956-01-24  Age: 63 y.o. MRN#: [id number] Attending Physician: Gladstone Lighter, MD Primary Care Physician: Patient, No Pcp Per Admit Date: 03/18/2019  Reason for Consultation/Follow-up: Establishing goals of care  Subjective: Called to bedside by CCM RN. Patient is resting in bed on high flow cannula at high settings with significant other and HPOA at bedside. He does not want to use a NRB. Milrinone in place. Patient states he has lived a good life, and has drank, smoked, and ate what he wanted. He states he understands that his body has to pay a price for that. He states he is tired and ready to go. He is ready to transition to comfort care. He would like to speak with a friend, hopefully in person before making the transition to comfort care. Discussion of comfort care until death in the hospital. Patient states he does not believe there is anything following earthly death. Significant other has faith based beliefs. Patient would like to speak with a chaplain. Chaplain called.   I completed a MOST form today and the signed original was placed in the chart. A photocopy was placed in the chart to be scanned into EMR. The patient outlined their wishes for the following treatment decisions:  Cardiopulmonary Resuscitation: Do Not Attempt Resuscitation (DNR/No CPR)  Medical Interventions: Comfort Measures: Keep clean, warm, and dry. Use medication by any route, positioning, wound care, and other measures to relieve pain and suffering. Use oxygen, suction and manual treatment of airway obstruction as needed for comfort. Do not transfer to the hospital unless comfort needs cannot be met in current location.   Antibiotics: No antibiotics (use other measures to relieve symptoms)  IV Fluids: No IV fluids (provide other measures to ensure comfort)  Feeding Tube: No feeding tube    Length of Stay: 7  Current Medications: Scheduled Meds:  . acyclovir ointment   Topical Q8H  . budesonide (PULMICORT) nebulizer solution  0.5 mg Nebulization BID  . carvedilol  3.125 mg Oral BID WC  . Chlorhexidine Gluconate Cloth  6 each Topical Daily  . enoxaparin (LOVENOX) injection  40 mg Subcutaneous Q24H  . feeding supplement  1 Container Oral TID BM  . FLUoxetine  40 mg Oral  Daily  . folic acid  1 mg Oral Daily  . influenza vac split quadrivalent PF  0.5 mL Intramuscular Tomorrow-1000  . insulin aspart  0-15 Units Subcutaneous TID WC  . insulin aspart  0-5 Units Subcutaneous QHS  . ipratropium-albuterol  3 mL Nebulization Q4H  . magnesium oxide  400 mg Oral BID  . mouth rinse  15 mL Mouth Rinse BID  . methylPREDNISolone (SOLU-MEDROL) injection  40 mg Intravenous Q12H  . multivitamin with minerals  1 tablet Oral Daily  . pneumococcal 23 valent vaccine  0.5 mL Intramuscular Tomorrow-1000  . sacubitril-valsartan  1 tablet Oral BID  . valACYclovir  1,000 mg Oral TID  . zolpidem  5 mg Oral QHS    Continuous Infusions: . sodium chloride Stopped (02/25/19 2227)  . furosemide (LASIX) infusion 10 mg/hr (Mar 27, 2019 0900)  . milrinone 0.375 mcg/kg/min (Mar 27, 2019 0900)  . thiamine injection 500 mg (02/25/19 1136)    PRN Meds: sodium chloride, acetaminophen **OR** acetaminophen, hydrALAZINE, morphine injection, ondansetron **OR** ondansetron (ZOFRAN) IV  Physical Exam          Vital Signs: BP 136/75   Pulse 90   Temp 97.9 F (36.6 C) (Oral)   Resp (!) 25   Ht 6' 3" (1.905 m)   Wt 91.5 kg   SpO2 91%   BMI 25.21 kg/m  SpO2: SpO2: 91 % O2 Device: O2 Device: High Flow Nasal Cannula O2 Flow Rate: O2 Flow Rate (L/min): 50 L/min  Intake/output summary:   Intake/Output Summary (Last 24 hours) at 03-27-19  1252 Last data filed at 03-27-2019 1100 Gross per 24 hour  Intake 1387.31 ml  Output 2650 ml  Net -1262.69 ml   LBM: Last BM Date: 02/23/19 Baseline Weight: Weight: 75 kg Most recent weight: Weight: 91.5 kg       Palliative Assessment/Data:    Flowsheet Rows     Most Recent Value  Intake Tab  Referral Department  Hospitalist  Unit at Time of Referral  Intermediate Care Unit  Palliative Care Primary Diagnosis  Cardiac  Date Notified  02/24/19  Palliative Care Type  New Palliative care  Reason for referral  Clarify Goals of Care  Date of Admission  03/13/2019  Date first seen by Palliative Care  02/25/19  # of days Palliative referral response time  1 Day(s)  # of days IP prior to Palliative referral  5  Clinical Assessment  Palliative Performance Scale Score  40%  Pain Max last 24 hours  Not able to report  Pain Min Last 24 hours  Not able to report  Dyspnea Max Last 24 Hours  Not able to report  Dyspnea Min Last 24 hours  Not able to report  Psychosocial & Spiritual Assessment  Palliative Care Outcomes      Patient Active Problem List   Diagnosis Date Noted  . Elevated liver enzymes   . Goals of care, counseling/discussion   . Palliative care by specialist   . DNR (do not resuscitate) discussion   . Acute respiratory failure ([facility])   . Acute pulmonary edema ([facility])   . Hyponatremia 02/25/2019  . Peripheral neuropathy 03/26/2014  . Diabetes ([facility]) 03/04/2014  . Hypertension 03/04/2014  . Hyperlipidemia 03/04/2014  . Depression 03/04/2014    Palliative Care Assessment & Plan   Recommendations/Plan:  Transition to comfort care once patient has spoken with friends/family.    Code Status:    Code Status Orders  (From admission, onward)  Start     Ordered   02/25/19 1137  Do not attempt resuscitation (DNR)  Continuous    Question Answer Comment  In the event of cardiac or respiratory ARREST Do not call a "code blue"   In the event of cardiac or  respiratory ARREST Do not perform Intubation, CPR, defibrillation or ACLS   In the event of cardiac or respiratory ARREST Use medication by any route, position, wound care, and other measures to relive pain and suffering. May use oxygen, suction and manual treatment of airway obstruction as needed for comfort.      02/25/19 1138        Code Status History    Date Active Date Inactive Code Status Order ID Comments User Context   03/06/2019 1716 02/25/2019 1138 Full Code [id number]  Gouru, Aruna, MD ED   Advance Care Planning Activity       Prognosis:    Hours - Days  Anticipate rapid decline with hospital death once the patient is liberated from high flow cannula.   Discharge Planning:  Anticipated Hospital Death  Care plan was discussed with RN, MD  Thank you for allowing the Palliative Medicine Team to assist in the care of this patient.   Time In: 12:10 Time Out: 1:00 Total Time 50 min Prolonged Time Billed  no      Greater than 50%  of this time was spent counseling and coordinating care related to the above assessment and plan.  Crystal Griffin, NP  Please contact Palliative Medicine Team phone at [phone] for questions and concerns.      

## 2019-03-20 NOTE — Death Summary Note (Signed)
DEATH SUMMARY   Patient Details  Name: Matthew Washington. MRN: [id number] DOB: [birth year]  Admission/Discharge Information   Admit Date:  03-08-19  Date of Death: Date of Death: 2019/03/15  Time of Death: Time of Death: 80  Length of Stay: 10-Sep-2022  Referring Physician: Patient, No Pcp Per   Reason(s) for Hospitalization  Acute hypoxic respiratory failure Symptomatic hyponatremia Systolic congestive heart failure, acute  Diagnoses  Preliminary cause of death:   Acute hypoxic respiratory failure Cardiogenic pulmonary edema Persistent cardiogenic shock physiology Secondary Diagnoses (including complications and co-morbidities):  Active Problems:   Hyponatremia   Acute pulmonary edema (HCC)   Acute respiratory failure (HCC)   Elevated liver enzymes   Goals of care, counseling/discussion   Palliative care by specialist   DNR (do not resuscitate) discussion Alcoholic cardiomyopathy Passive congestion of the liver due to systolic heart failure Alcoholism Hypomagnesemia  Brief Hospital Course (including significant findings, care, treatment, and services provided and events leading to death)  Matthew Washington. is a 63 y.o. year old male who presented to [facility] ER on  Mar 08, 2019 from home via EMS with an inability to ambulate and worsening neuropathy onset of symptoms 5 days prior to presentation.  It was also reported pt unable to ambulate when EMS arrived on the scene.  Pt also stated he has been unable to work due to worsening neuropathy.  He also endorsed diarrhea (1 stool daily), insomnia, ETOH abuse (drinks 3 shots of liquor daily last alcoholic beverage 61/6), and shortness of breath.  Upon arrival to the ER pt hypoxic with O2 sats 83% on RA, therefore pt placed on 3L O2 via nasal canula (he does not wear chronic O2). CXR concerning for cardiomegaly and pulmonary edema, COVID-19 negative.  Lab results revealed Na+ 114, chloride 79, CO2 20, glucose 125, alk phos 152, AST 257,  ALT 189, total bilirubin 5.0, troponin 32, BNP 2,100, PT 22.5, and INR 2.0. He received 1L NS bolus and hypertonic saline initiated at 30 ml/hr.  Pt subsequently admitted to the stepdown unit by hospitalist team for additional workup and treatment.  The patient was placed on a Lasix infusion in the stepdown unit.  Nephrology was consulted.  Echocardiogram on 3 September showed ejection fraction of 20% with combined systolic and diastolic dysfunction.  There was also reduced systolic function of the right ventricle.  The patient had no wall motion normality it was believed that his cardiomyopathy was due to chronic alcohol ingestion.  He was placed on high-dose thiamine.  The patient continued to persist with hyponatremia, pulmonary edema and elevated hepatic function panel likely due to passive congestion of the liver and a combination of alcoholic hepatitis.  The patient continued to do poorly and after evaluation by cardiology was noted that his prognosis was poor.  Cardiomyopathy again was believed to be due to alcohol consumption.  The patient was placed on milrinone infusion to see if this would help with diuresis and management of his congestive failure.  Patient failed to respond to these measures.  He did not want high flow O2 or BiPAP.  He was refusing therapies.  A palliative consultation was done on 25 February 2019.  The patient's significant other participated on the discussion.  The patient opted to be transitioned to comfort care.  Once he made this decision the patient removed his own heart monitor and oxygen.  And called his nurse to discontinue IVs.  As the nurse entered the room the patient expired.  The  patient expired at 1430 hrs. on 03-04-19.    Pertinent Labs and Studies  Significant Diagnostic Studies Dg Chest 1 View  Result Date: 03/06/2019 CLINICAL DATA:  Dyspnea and nausea vomiting EXAM: CHEST  1 VIEW COMPARISON:  None. FINDINGS: There is mild cardiomegaly. Increased  interstitial markings are seen throughout both lungs with pulmonary vascular congestion. There is trace blunting of the left costophrenic angle which could be due to trace pleural effusion. No acute osseous abnormality. IMPRESSION: Pulmonary edema and cardiomegaly. Trace left pleural effusion. Electronically Signed   By: Prudencio Pair M.D.   On: 02/22/2019 14:44   Ct Head Wo Contrast  Result Date: 03/08/2019 CLINICAL DATA:  Progressive neuropathy. The patient is now unable to walk. EXAM: CT HEAD WITHOUT CONTRAST TECHNIQUE: Contiguous axial images were obtained from the base of the skull through the vertex without intravenous contrast. COMPARISON:  None. FINDINGS: Brain: No evidence of acute infarction, hemorrhage, hydrocephalus, extra-axial collection or mass lesion/mass effect. There is atrophy and chronic microvascular ischemic change. Vascular: No hyperdense vessel or unexpected calcification. Skull: Intact.  No focal lesion. Sinuses/Orbits: Negative. Other: None. IMPRESSION: No acute abnormality. Atrophy and chronic microvascular ischemic change. Electronically Signed   By: Inge Rise M.D.   On: 02/21/2019 17:26   US Venous Img Lower Bilateral  Result Date: 02/20/2019 CLINICAL DATA:  Bilateral lower extremity pain and edema. History of smoking. Evaluate for DVT. EXAM: BILATERAL LOWER EXTREMITY VENOUS DOPPLER ULTRASOUND TECHNIQUE: Gray-scale sonography with graded compression, as well as color Doppler and duplex ultrasound were performed to evaluate the lower extremity deep venous systems from the level of the common femoral vein and including the common femoral, femoral, profunda femoral, popliteal and calf veins including the posterior tibial, peroneal and gastrocnemius veins when visible. The superficial great saphenous vein was also interrogated. Spectral Doppler was utilized to evaluate flow at rest and with distal augmentation maneuvers in the common femoral, femoral and popliteal veins.  COMPARISON:  None. FINDINGS: RIGHT LOWER EXTREMITY Common Femoral Vein: No evidence of thrombus. Normal compressibility, respiratory phasicity and response to augmentation. Saphenofemoral Junction: No evidence of thrombus. Normal compressibility and flow on color Doppler imaging. Profunda Femoral Vein: No evidence of thrombus. Normal compressibility and flow on color Doppler imaging. Femoral Vein: No evidence of thrombus. Normal compressibility, respiratory phasicity and response to augmentation. Popliteal Vein: No evidence of thrombus. Normal compressibility, respiratory phasicity and response to augmentation. Calf Veins: No evidence of thrombus. Normal compressibility and flow on color Doppler imaging. Superficial Great Saphenous Vein: No evidence of thrombus. Normal compressibility. Venous Reflux:  None. Other Findings: Minimal amount of subcutaneous edema is noted at the level the right lower leg. Pulsatile waveforms are demonstrated throughout near the entirety of the right lower extremity venous system. LEFT LOWER EXTREMITY Common Femoral Vein: No evidence of thrombus. Normal compressibility, respiratory phasicity and response to augmentation. Saphenofemoral Junction: No evidence of thrombus. Normal compressibility and flow on color Doppler imaging. Profunda Femoral Vein: No evidence of thrombus. Normal compressibility and flow on color Doppler imaging. Femoral Vein: No evidence of thrombus. Normal compressibility, respiratory phasicity and response to augmentation. Popliteal Vein: No evidence of thrombus. Normal compressibility, respiratory phasicity and response to augmentation. Calf Veins: No evidence of thrombus. Normal compressibility and flow on color Doppler imaging. Superficial Great Saphenous Vein: No evidence of thrombus. Normal compressibility. Venous Reflux:  None. Other Findings: Moderate amount of subcutaneous edema is noted at the level of the left lower leg. Pulsatile waveforms demonstrated  throughout near the  entirety of left lower extremity venous system. IMPRESSION: 1. No evidence of DVT within either lower extremity. 2. Pulsatile waveforms are demonstrated throughout near the entirety of the bilateral lower extremity venous systems, nonspecific though could be seen in the setting of right-sided heart failure. Clinical correlation is advised. Electronically Signed   By: Sandi Mariscal M.D.   On: 02/20/2019 16:17   Dg Chest Port 1 View  Result Date: 2019/03/01 CLINICAL DATA:  Cough and weakness EXAM: PORTABLE CHEST 1 VIEW COMPARISON:  02/23/2019 FINDINGS: Cardiac shadow remains enlarged. Diffuse bilateral airspace disease is again seen and stable. No sizable effusion is noted. No bony abnormality is seen. IMPRESSION: Stable bilateral airspace disease. Electronically Signed   By: Inez Catalina M.D.   On: 2019/03/01 09:04   Dg Chest Port 1 View  Result Date: 02/23/2019 CLINICAL DATA:  Hypoxia EXAM: PORTABLE CHEST 1 VIEW COMPARISON:  02/20/2019 FINDINGS: Severe diffuse airspace disease throughout the lungs, similar to prior study. Cardiomegaly. No effusions or pneumothorax. No acute bony abnormality. IMPRESSION: Severe diffuse bilateral airspace disease, unchanged. Electronically Signed   By: Rolm Baptise M.D.   On: 02/23/2019 21:06   Dg Chest Port 1 View  Result Date: 02/20/2019 CLINICAL DATA:  Acute respiratory failure. EXAM: PORTABLE CHEST 1 VIEW COMPARISON:  Radiograph yesterday. FINDINGS: Mild cardiomegaly with no significant change from prior exam. Diffusely increased interstitial markings are again seen. Increased ill-defined infrahilar opacities since prior. Possible small left pleural effusion. No pneumothorax. IMPRESSION: 1. Cardiomegaly. Diffusely increased interstitial opacities similar to prior exam, pulmonary edema versus underlying chronic lung disease. 2. Progressive patchy opacities in the bases may be vascular or airspace disease/atelectasis. Electronically Signed   By: Keith Rake M.D.   On: 02/20/2019 03:45   US Abdomen Limited Ruq  Result Date: 02/22/2019 CLINICAL DATA:  Elevated liver function test. EXAM: ULTRASOUND ABDOMEN LIMITED RIGHT UPPER QUADRANT COMPARISON:  None. FINDINGS: Gallbladder: There is some sludge in the gallbladder. No pericholecystic fluid. Gallbladder wall is minimally thickened at 0.4 cm but the gallbladder is incompletely distended. Sonographer reports negative Murphy's sign. Common bile duct: Diameter: 0.3 cm Liver: The liver border appears somewhat lobulated and parenchymal echogenicity is increased throughout. No focal lesion or intrahepatic biliary ductal dilatation. Portal vein is patent on color Doppler imaging with normal direction of blood flow towards the liver. Other: None. IMPRESSION: Small volume of gallbladder sludge without evidence of cholecystitis. Fatty infiltration of the liver. The liver border appears mildly lobulated suggestive of cirrhosis. Electronically Signed   By: Inge Rise M.D.   On: 02/22/2019 10:00    Microbiology No results found for this or any previous visit (from the past 240 hour(s)).  Lab Basic Metabolic Panel: Recent Labs  Lab 02/24/19 1533 02/25/19 0250 2019/03/01 0452  NA 125* 125* 126*  K  --  3.3* 3.7  CL  --  85* 87*  CO2  --  27 28  GLUCOSE  --  271* 248*  BUN  --  20 23  CREATININE  --  0.67 0.61  CALCIUM  --  8.4* 8.7*  MG  --  1.9 1.7   Liver Function Tests: No results for input(s): AST, ALT, ALKPHOS, BILITOT, PROT, ALBUMIN in the last 168 hours. No results for input(s): LIPASE, AMYLASE in the last 168 hours. No results for input(s): AMMONIA in the last 168 hours. CBC: Recent Labs  Lab 02/25/19 0250  WBC 8.9  NEUTROABS 8.3*  HGB 13.4  HCT 36.5*  MCV 88.2  PLT 127*  Cardiac Enzymes: No results for input(s): CKTOTAL, CKMB, CKMBINDEX, TROPONINI in the last 168 hours. Sepsis Labs: Recent Labs  Lab 02/25/19 0250  WBC 8.9    Procedures/Operations  1.  2D echo  02/20/2019    C. Derrill Kay, MD  PCCM   03/03/2019, 2:32 PM

## 2019-03-20 NOTE — Plan of Care (Signed)

## 2019-03-20 NOTE — Progress Notes (Signed)
   03/18/2019 1500  Clinical Encounter Type  Visited With Family  Visit Type Follow-up;Death  Referral From Nurse  Consult/Referral To Chaplain  Spiritual Encounters  Spiritual Needs Grief support  Stress Factors  Family Stress Factors Loss  Chaplain visit with family (Donnie) at the transition of patient and gave comforting words and walk family out to visiting parking lot. Donnie was Patent attorney.

## 2019-03-20 NOTE — Plan of Care (Signed)
Deceased

## 2019-03-20 NOTE — Progress Notes (Signed)
[facility]  ROUNDING NOTE   Subjective:   Na 126.   HFNC  Furosemide gtt 10mg /hr UOP  Roommate at bedside.   Objective:  Vital signs in last 24 hours:  Temp:  [97.9 F (36.6 C)-98.1 F (36.7 C)] 97.9 F (36.6 C) (09/09 0200) Pulse Rate:  [83-98] 90 (09/09 0900) Resp:  [16-30] 25 (09/09 0900) BP: (114-153)/(59-93) 136/75 (09/09 0900) SpO2:  [63 %-99 %] 91 % (09/09 1114) FiO2 (%):  [55 %-100 %] 100 % (09/09 1114) Weight:  [91.5 kg] 91.5 kg (09/09 0415)  Weight change: 0.2 kg Filed Weights   02/23/19 2250 02/25/19 0542 03/07/2019 0415  Weight: 91.4 kg 91.3 kg 91.5 kg    Intake/Output: I/O last 3 completed shifts: In: 1931.4 [P.O.:780; I.V.:665; IV Piggyback:486.4] Out: 3500 [Urine:3500]   Intake/Output this shift:  Total I/O In: 280.5 [P.O.:240; I.V.:40.5] Out: 550 [Urine:550]  Physical Exam: General: Critically ill   Head: Normocephalic, atraumatic.    Eyes: +icterus  Neck: Supple, trachea midline  Lungs:  Bilateral crackles  Heart: regular  Abdomen:  Soft, nontender, bowel sounds present  Extremities: 2+ peripheral edema.  Neurologic: Alert and oriented  Skin: No lesions       Basic Metabolic Panel: Recent Labs  Lab 02/20/19 0605  02/21/19 0329  02/22/19 0748  02/23/19 0743  02/24/19 0517 02/24/19 0826 02/24/19 1533 02/25/19 0250 03-07-19 0452  NA 117*   < > 119*   < > 122*   < > 124*   < > 126* 125* 125* 125* 126*  K 3.3*   < > 3.2*   < > 4.6  --  3.7  --  3.1*  --   --  3.3* 3.7  CL 82*  --  81*  --  84*  --  86*  --  87*  --   --  85* 87*  CO2 21*  --  24  --  23  --  27  --  28  --   --  27 28  GLUCOSE 111*  --  122*  --  142*  --  121*  --  110*  --   --  271* 248*  BUN 14  --  11  --  14  --  16  --  17  --   --  20 23  CREATININE 0.72  --  0.69  --  0.98  --  0.83  --  0.75  --   --  0.67 0.61  CALCIUM 8.4*  --  8.3*  --  8.8*  --  8.8*  --  8.4*  --   --  8.4* 8.7*  MG 1.4*  --  1.5*   < > 1.7  --  1.7  --  1.7   --   --  1.9 1.7  PHOS 3.3  --  3.3  --  2.4*  --  3.7  --   --   --   --   --   --    < > = values in this interval not displayed.    Liver Function Tests: Recent Labs  Lab 03/10/2019 1429 02/20/19 0605 02/21/19 0329 02/22/19 0748 02/24/19 0517  AST 257* 155* 117* 98* 55*  ALT 189* 136* 107* 92* 57*  ALKPHOS 152* 133* 164* 146* 156*  BILITOT 5.0* 4.5* 5.1* 5.5* 6.1*  PROT 7.5 6.6 6.8 6.9 6.7  ALBUMIN 3.4* 3.0* 3.6 3.7 3.7   No results for input(s): LIPASE, AMYLASE  in the last 168 hours. No results for input(s): AMMONIA in the last 168 hours.  CBC: Recent Labs  Lab 2018-12-05 1429 02/20/19 0605 02/21/19 0236 02/22/19 0748 02/23/19 0743 02/24/19 0517 02/25/19 0250  WBC 8.0 7.7 6.5 9.5 10.4 10.4 8.9  NEUTROABS 6.7 6.7 5.5 8.0*  --   --  8.3*  HGB 15.2 14.3 13.4 14.8 13.4 12.8* 13.4  HCT 41.0 38.4* 36.5* 41.5 37.8* 35.8* 36.5*  MCV 88.7 88.1 88.4 89.4 90.2 90.9 88.2  PLT 155 150 138* 132* 122* 116* 127*    Cardiac Enzymes: No results for input(s): CKTOTAL, CKMB, CKMBINDEX, TROPONINI in the last 168 hours.  BNP: Invalid input(s): POCBNP  CBG: Recent Labs  Lab 02/25/19 1550 02/25/19 1600 02/25/19 2157 03/04/2019 0712 02/28/2019 1114  GLUCAP 191* 310* 227* 230* 232*    Microbiology: Results for orders placed or performed during the hospital encounter of 2018-12-05  SARS Coronavirus 2 [facility](Hospital order, Performed in [facility] lab) Nasopharyngeal Nasopharyngeal Swab     Status: None   Collection Time: 2018-12-05  2:33 PM   Specimen: Nasopharyngeal Swab  Result Value Ref Range Status   SARS Coronavirus 2 NEGATIVE NEGATIVE Final    Comment: (NOTE) If result is NEGATIVE SARS-CoV-2 target nucleic acids are NOT DETECTED. The SARS-CoV-2 RNA is generally detectable in upper and lower  respiratory specimens during the acute phase of infection. The lowest  concentration of SARS-CoV-2 viral copies this assay can detect is 250  copies / mL. A negative result does not  preclude SARS-CoV-2 infection  and should not be used as the sole basis for treatment or other  patient management decisions.  A negative result may occur with  improper specimen collection / handling, submission of specimen other  than nasopharyngeal swab, presence of viral mutation(s) within the  areas targeted by this assay, and inadequate number of viral copies  (<250 copies / mL). A negative result must be combined with clinical  observations, patient history, and epidemiological information. If result is POSITIVE SARS-CoV-2 target nucleic acids are DETECTED. The SARS-CoV-2 RNA is generally detectable in upper and lower  respiratory specimens dur ing the acute phase of infection.  Positive  results are indicative of active infection with SARS-CoV-2.  Clinical  correlation with patient history and other diagnostic information is  necessary to determine patient infection status.  Positive results do  not rule out bacterial infection or co-infection with other viruses. If result is PRESUMPTIVE POSTIVE SARS-CoV-2 nucleic acids MAY BE PRESENT.   A presumptive positive result was obtained on the submitted specimen  and confirmed on repeat testing.  While 2019 novel coronavirus  (SARS-CoV-2) nucleic acids may be present in the submitted sample  additional confirmatory testing may be necessary for epidemiological  and / or clinical management purposes  to differentiate between  SARS-CoV-2 and other Sarbecovirus currently known to infect humans.  If clinically indicated additional testing with an alternate test  methodology [phone]([id number]) is advised. The SARS-CoV-2 RNA is generally  detectable in upper and lower respiratory sp ecimens during the acute  phase of infection. The expected result is Negative. Fact Sheet for Patients:  BoilerBrush.com.cyhttps://www.fda.gov/media/136312/download Fact Sheet for Healthcare Providers: https://pope.com/https://www.fda.gov/media/136313/download This test is not yet approved or cleared by  the [place] FDA and has been authorized for detection and/or diagnosis of SARS-CoV-2 by FDA under an Emergency Use Authorization (EUA).  This EUA will remain in effect (meaning this test can be used) for the duration of the COVID-19 declaration under Section 564(b)(1)  of the Act, 21 U.S.C. section 360bbb-3(b)(1), unless the authorization is terminated or revoked sooner. Performed at [facility], [address] [place], [place] [address]   MRSA PCR Screening     Status: None   Collection Time: 02/23/2019  6:27 PM   Specimen: Nasopharyngeal  Result Value Ref Range Status   MRSA by PCR NEGATIVE NEGATIVE Final    Comment:        The GeneXpert MRSA Assay (FDA approved for NASAL specimens only), is one component of a comprehensive MRSA colonization surveillance program. It is not intended to diagnose MRSA infection nor to guide or monitor treatment for MRSA infections. Performed at [facility], [address] [place], [place] [address]     Coagulation Studies: No results for input(s): LABPROT, INR in the last 72 hours.  Urinalysis: No results for input(s): COLORURINE, LABSPEC, PHURINE, GLUCOSEU, HGBUR, BILIRUBINUR, KETONESUR, PROTEINUR, UROBILINOGEN, NITRITE, LEUKOCYTESUR in the last 72 hours.  Invalid input(s): APPERANCEUR    Imaging: Dg Chest Port 1 View  Result Date: 2019-03-13 CLINICAL DATA:  Cough and weakness EXAM: PORTABLE CHEST 1 VIEW COMPARISON:  02/23/2019 FINDINGS: Cardiac shadow remains enlarged. Diffuse bilateral airspace disease is again seen and stable. No sizable effusion is noted. No bony abnormality is seen. IMPRESSION: Stable bilateral airspace disease. Electronically Signed   By: Inez Catalina M.D.   On: 03-13-2019 09:04     Medications:   . sodium chloride Stopped (02/25/19 2227)  . furosemide (LASIX) infusion 10 mg/hr (03/13/2019 0900)  . milrinone 0.375 mcg/kg/min (03-13-2019 0900)  . thiamine injection 500 mg (02/25/19 1136)    . acyclovir ointment   Topical Q8H  . budesonide (PULMICORT) nebulizer solution  0.5 mg Nebulization BID  . carvedilol  3.125 mg Oral BID WC  . Chlorhexidine Gluconate Cloth  6 each Topical Daily  . enoxaparin (LOVENOX) injection  40 mg Subcutaneous Q24H  . feeding supplement  1 Container Oral TID BM  . FLUoxetine  40 mg Oral Daily  . folic acid  1 mg Oral Daily  . influenza vac split quadrivalent PF  0.5 mL Intramuscular Tomorrow-1000  . insulin aspart  0-15 Units Subcutaneous TID WC  . insulin aspart  0-5 Units Subcutaneous QHS  . ipratropium-albuterol  3 mL Nebulization Q4H  . magnesium oxide  400 mg Oral BID  . mouth rinse  15 mL Mouth Rinse BID  . methylPREDNISolone (SOLU-MEDROL) injection  40 mg Intravenous Q12H  . multivitamin with minerals  1 tablet Oral Daily  . pneumococcal 23 valent vaccine  0.5 mL Intramuscular Tomorrow-1000  . sacubitril-valsartan  1 tablet Oral BID  . valACYclovir  1,000 mg Oral TID  . zolpidem  5 mg Oral QHS   sodium chloride, acetaminophen **OR** acetaminophen, hydrALAZINE, morphine injection, ondansetron **OR** ondansetron (ZOFRAN) IV  Assessment/ Plan:  63 y.o. male  Matthew Washington. is a 63 y.o. white male with diabetes mellitus type 2, hypertension, depression, hyperlipidemia, systolic congestive heart failure who was admitted to [facility] on 03/12/2019    1.  Hyponatremia: secondary to volume overload. - Continue furosemide gtt - Milrinone as per ICU  2. Hypokalemia: secondary to diuresis and alcoholic nephropathy - PO potassium administered yesterday  3. Hypertension: blood pressure at goal. - Entresto   4. Systolic congestive heart failureL EF 20-25%. 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  - IV thiamine    LOS: 7 Matthew Washington 2020-02-2411:54 PM

## 2019-03-20 NOTE — Progress Notes (Addendum)
Pharmacy Electrolyte Monitoring Consult:  Pharmacy consulted to assist in monitoring and replacing electrolytes in this 63 y.o. male admitted on 2019/03/05. Patient now extubated. Patient requiring furosemide infusion.   Labs:  Sodium (mmol/L)  Date Value  03/08/2019 126 (L)  11/11/2014 139  04/16/2014 137   Potassium (mmol/L)  Date Value  03/11/2019 3.7  04/16/2014 4.4   Magnesium (mg/dL)  Date Value  02/25/2019 1.9   Phosphorus (mg/dL)  Date Value  02/23/2019 3.7   Calcium (mg/dL)  Date Value  03/11/2019 8.7 (L)   Calcium, Total (mg/dL)  Date Value  04/16/2014 8.3 (L)   Albumin (g/dL)  Date Value  02/24/2019 3.7  01/16/2014 4.0    Assessment/Plan:  Potassium improved today oral supplementation. Another 40 mEq oral potassium today to target potassium of 4.   Magnesium not at goal on oral supplementation. Will give IV magnesium 2g x1 and increase oral supplementation to 800 mg BID.   Will check BMP/Magnesium with am labs.   Will replace for goal potassium ~ 4 and goal magnesium ~ 2.   Pharmacy will continue to monitor and adjust per consult.   [facility] Resident 03/17/2019 8:04 AM

## 2019-03-20 NOTE — Progress Notes (Signed)
PT Cancellation Note  Patient Details Name: Matthew Washington. MRN: [id number] DOB: [birth year]   Cancelled Treatment:    Reason Eval/Treat Not Completed: Medical issues which prohibited therapy(Per chart review and discussion with palliative care provider, patient planning transition to comfort care.  Will complete PT orders at this time. Please re-consult should goals of care change.)   Priseis Cratty H. Owens Shark, PT, DPT, NCS 03-14-2019, 1:46 PM [phone]

## 2019-03-20 NOTE — Progress Notes (Signed)
[facility] - [place] at [facility]   PATIENT NAME: Matthew Washington    MR#:  [id number]  DATE OF BIRTH:  09/02/1955  SUBJECTIVE:  CHIEF COMPLAINT:   Chief Complaint  Patient presents with  . Weakness   --On high flow nasal cannula, still on high FiO2. -On Lasix drip.  Also complains of significant left lower abdominal pain from the shingles.  REVIEW OF SYSTEMS:  Review of Systems  Constitutional: Positive for malaise/fatigue. Negative for chills and fever.  HENT: Negative for ear discharge, hearing loss and nosebleeds.   Respiratory: Positive for shortness of breath. Negative for cough and wheezing.   Cardiovascular: Positive for leg swelling. Negative for chest pain and palpitations.  Gastrointestinal: Negative for abdominal pain, constipation, diarrhea, nausea and vomiting.  Genitourinary: Negative for dysuria.  Musculoskeletal: Positive for myalgias.  Skin: Positive for rash.  Neurological: Positive for weakness. Negative for dizziness, speech change, focal weakness, seizures and headaches.  Psychiatric/Behavioral: Negative for depression.    DRUG ALLERGIES:   Allergies  Allergen Reactions  . Shellfish Allergy Shortness Of Breath and Swelling  . Penicillins Hives  . Trazodone Other (See Comments)    VITALS:  Blood pressure 136/75, pulse 90, temperature 97.9 F (36.6 C), temperature source Oral, resp. rate (!) 25, height 6\' 3"  (1.905 m), weight 91.5 kg, SpO2 (!) 87 %.  PHYSICAL EXAMINATION:  Physical Exam   GENERAL:  63 y.o.-year-old patient lying in the bed, chronically ill-appearing EYES: Pupils equal, round, reactive to light and accommodation. No scleral icterus. Extraocular muscles intact.  HEENT: Head atraumatic, normocephalic. Oropharynx and nasopharynx clear.  NECK:  Supple, no jugular venous distention. No thyroid enlargement, no tenderness.  LUNGS: Normal breath sounds bilaterally, no wheezing, rales,rhonchi or crepitation. No use of  accessory muscles of respiration.  Decreased bibasilar breath sounds CARDIOVASCULAR: S1, S2 normal. No murmurs, rubs, or gallops.  ABDOMEN: Significant abdominal wall edema.  Soft, nontender, distended. Bowel sounds present. No organomegaly or mass.  EXTREMITIES: No  cyanosis, or clubbing.  1+ pedal edema noted. NEUROLOGIC: Cranial nerves II through XII are intact. Muscle strength 5/5 in all extremities. Sensation intact. Gait not checked.  Severe global weakness PSYCHIATRIC: The patient is alert and oriented x 3.  SKIN: No obvious rash, lesion, erythema along the lower left abdominal wall spreading anteriorly with some vesicular lesions.   LABORATORY PANEL:   CBC Recent Labs  Lab 02/25/19 0250  WBC 8.9  HGB 13.4  HCT 36.5*  PLT 127*   ------------------------------------------------------------------------------------------------------------------  Chemistries  Recent Labs  Lab 02/24/19 0517  02/25/19 0250 17-Oct-2018 0452  NA 126*   < > 125* 126*  K 3.1*  --  3.3* 3.7  CL 87*  --  85* 87*  CO2 28  --  27 28  GLUCOSE 110*  --  271* 248*  BUN 17  --  20 23  CREATININE 0.75  --  0.67 0.61  CALCIUM 8.4*  --  8.4* 8.7*  MG 1.7  --  1.9  --   AST 55*  --   --   --   ALT 57*  --   --   --   ALKPHOS 156*  --   --   --   BILITOT 6.1*  --   --   --    < > = values in this interval not displayed.   ------------------------------------------------------------------------------------------------------------------  Cardiac Enzymes No results for input(s): TROPONINI in the last 168 hours. ------------------------------------------------------------------------------------------------------------------  RADIOLOGY:  Dg Chest  Port 1 View  Result Date: 03/16/2019 CLINICAL DATA:  Cough and weakness EXAM: PORTABLE CHEST 1 VIEW COMPARISON:  02/23/2019 FINDINGS: Cardiac shadow remains enlarged. Diffuse bilateral airspace disease is again seen and stable. No sizable effusion is noted. No  bony abnormality is seen. IMPRESSION: Stable bilateral airspace disease. Electronically Signed   By: Inez Catalina M.D.   On: 03/13/2019 09:04    EKG:   Orders placed or performed during the hospital encounter of 03-07-19  . EKG 12-Lead  . EKG 12-Lead  . ED EKG  . ED EKG    ASSESSMENT AND PLAN:   63 year old male with past medical history significant for hypertension, diabetes, major depressive disorder, history of alcohol use presents to hospital secondary to difficulty breathing  1.  Acute hypoxic respiratory failure-secondary to acute systolic heart failure exacerbation,   Patient is on ICU, on high flow nasal cannula. Still Fio2 of 95% -Appreciate cardiology consult.  Echocardiogram with EF of 20 to 25%.  Patient remains on Lasix drip and IV albumin. -IV steroids have been added. -Started on milrinone - on Entresto and Coreg  2.  Hyponatremia-secondary to CHF, hypervolemia -Continue Lasix drip.  Recommend fluid restriction.  Appreciate nephrology consult. -Sodium at 126  3.  Left abdominal wall shingles-on Valtrex and topical acyclovir.  4.  Elevated LFTs-ultrasound abdomen with cirrhosis and gallbladder sludge.  History of alcohol use. -Continue thiamine and other supplements. -Monitor for any withdrawals  5.  DVT prophylaxis-Lovenox  Lives at home with his male partner Overall poor long term prognosis. Palliative care consult   All the records are reviewed and case discussed with Care Management/Social Workerr. Management plans discussed with the patient, family and they are in agreement.  CODE STATUS:  DNR  TOTAL TIME TAKING CARE OF THIS PATIENT: 34 minutes.   POSSIBLE D/C IN ? DAYS, DEPENDING ON CLINICAL CONDITION.   Gladstone Lighter M.D on 03/12/2019 at 10:56 AM  Between 7am to 6pm - Pager - [phone]  After 6pm go to [email or web address] - password [email or web address] [facility]  Office  [phone]  CC: Primary care physician; Patient, No Pcp  Per

## 2019-03-20 NOTE — Progress Notes (Signed)
Nutrition Brief Follow-Up Note  Chart reviewed. Patient will be transitioning to comfort care.  No further nutrition interventions warranted at this time. Please re-consult RD as needed.   Willey Blade, MS, [place], LDN Office: [phone] Pager: [phone] After Hours/Weekend Pager: [phone]

## 2019-03-20 NOTE — Progress Notes (Signed)
   03-17-19 1100  Clinical Encounter Type  Visited With Patient and family together  Visit Type Follow-up  Referral From Chaplain  Consult/Referral To [facility]  Chaplain follow up with patient to complete AD. Notary and witness arrived and AD was completed.

## 2019-03-20 NOTE — Progress Notes (Addendum)
Shift summary:  - UTA or obtain AM temp (too cold). Patient reports feeling "hot". Will reassess.   - Patient transitioning towards comfort care only. Has removed himself from heart monitor. Will CTM pleth for as long as patient will allow.   1349 hrs: Patient removed himself from HFNC and asked this RN to turn off his IVs. I spoke with the patient and to clarify his understanding of the situation to which he replied "I just want to be left alone. I'm ready to go." Patient's significant other was called to return to the hospital. MD notified as well.   Time of death called at 1430 hrs. Absence of cardiac or respiratory function confirmed by this RN and Casey Burkitt, RN. Patient self-removed ECG monitor so no cardiac strip is available for review. [facility] called and updated as well. MD notified.

## 2019-03-20 DEATH — deceased
